# Patient Record
Sex: Female | Born: 1980
Health system: Southern US, Community
[De-identification: ages and names within clinical notes are randomized; demographics above are authoritative.]

## PROBLEM LIST (undated history)

## (undated) ENCOUNTER — Inpatient Hospital Stay (HOSPITAL_COMMUNITY): Payer: Self-pay

## (undated) DIAGNOSIS — E119 Type 2 diabetes mellitus without complications: Secondary | ICD-10-CM

## (undated) DIAGNOSIS — R1013 Epigastric pain: Secondary | ICD-10-CM

## (undated) DIAGNOSIS — K8020 Calculus of gallbladder without cholecystitis without obstruction: Secondary | ICD-10-CM

## (undated) DIAGNOSIS — R51 Headache: Secondary | ICD-10-CM

## (undated) DIAGNOSIS — R21 Rash and other nonspecific skin eruption: Secondary | ICD-10-CM

## (undated) DIAGNOSIS — Z8619 Personal history of other infectious and parasitic diseases: Secondary | ICD-10-CM

## (undated) DIAGNOSIS — E785 Hyperlipidemia, unspecified: Secondary | ICD-10-CM

## (undated) DIAGNOSIS — O24419 Gestational diabetes mellitus in pregnancy, unspecified control: Secondary | ICD-10-CM

## (undated) HISTORY — DX: Epigastric pain: R10.13

## (undated) HISTORY — PX: RHINOPLASTY: SUR1284

## (undated) HISTORY — DX: Hyperlipidemia, unspecified: E78.5

## (undated) HISTORY — DX: Headache: R51

## (undated) HISTORY — DX: Rash and other nonspecific skin eruption: R21

## (undated) HISTORY — PX: CHOLECYSTECTOMY: SHX55

## (undated) HISTORY — DX: Calculus of gallbladder without cholecystitis without obstruction: K80.20

## (undated) HISTORY — DX: Personal history of other infectious and parasitic diseases: Z86.19

---
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
## 2001-12-26 ENCOUNTER — Other Ambulatory Visit: Admission: RE | Admit: 2001-12-26 | Discharge: 2001-12-26 | Payer: Self-pay | Admitting: Internal Medicine

## 2003-08-09 ENCOUNTER — Other Ambulatory Visit: Admission: RE | Admit: 2003-08-09 | Discharge: 2003-08-09 | Payer: Self-pay | Admitting: Internal Medicine

## 2005-06-16 ENCOUNTER — Other Ambulatory Visit: Admission: RE | Admit: 2005-06-16 | Discharge: 2005-06-16 | Payer: Self-pay | Admitting: Obstetrics and Gynecology

## 2006-05-15 ENCOUNTER — Emergency Department (HOSPITAL_COMMUNITY): Admission: EM | Admit: 2006-05-15 | Discharge: 2006-05-15 | Payer: Self-pay | Admitting: Emergency Medicine

## 2006-12-02 ENCOUNTER — Other Ambulatory Visit: Admission: RE | Admit: 2006-12-02 | Discharge: 2006-12-02 | Payer: Self-pay | Admitting: Internal Medicine

## 2007-12-07 ENCOUNTER — Inpatient Hospital Stay (HOSPITAL_COMMUNITY): Admission: AD | Admit: 2007-12-07 | Discharge: 2007-12-07 | Payer: Self-pay | Admitting: Obstetrics and Gynecology

## 2008-03-15 ENCOUNTER — Inpatient Hospital Stay (HOSPITAL_COMMUNITY): Admission: AD | Admit: 2008-03-15 | Discharge: 2008-03-15 | Payer: Self-pay | Admitting: Obstetrics and Gynecology

## 2008-03-16 ENCOUNTER — Inpatient Hospital Stay (HOSPITAL_COMMUNITY): Admission: AD | Admit: 2008-03-16 | Discharge: 2008-03-16 | Payer: Self-pay | Admitting: Obstetrics and Gynecology

## 2008-05-10 ENCOUNTER — Ambulatory Visit (HOSPITAL_COMMUNITY): Admission: RE | Admit: 2008-05-10 | Discharge: 2008-05-10 | Payer: Self-pay | Admitting: Gynecology

## 2008-09-20 ENCOUNTER — Ambulatory Visit: Payer: Self-pay | Admitting: Internal Medicine

## 2008-11-06 ENCOUNTER — Ambulatory Visit: Payer: Self-pay | Admitting: Internal Medicine

## 2009-01-08 ENCOUNTER — Encounter: Admission: RE | Admit: 2009-01-08 | Discharge: 2009-01-08 | Payer: Self-pay | Admitting: Obstetrics and Gynecology

## 2009-02-27 ENCOUNTER — Inpatient Hospital Stay (HOSPITAL_COMMUNITY): Admission: AD | Admit: 2009-02-27 | Discharge: 2009-03-01 | Payer: Self-pay | Admitting: Obstetrics and Gynecology

## 2010-01-23 ENCOUNTER — Ambulatory Visit: Payer: Self-pay | Admitting: Internal Medicine

## 2010-03-03 ENCOUNTER — Ambulatory Visit: Payer: Self-pay | Admitting: Internal Medicine

## 2010-03-04 ENCOUNTER — Ambulatory Visit: Payer: Self-pay | Admitting: Internal Medicine

## 2010-03-05 ENCOUNTER — Encounter
Admission: RE | Admit: 2010-03-05 | Discharge: 2010-03-05 | Payer: Self-pay | Source: Home / Self Care | Attending: Internal Medicine | Admitting: Internal Medicine

## 2010-04-01 ENCOUNTER — Ambulatory Visit: Admit: 2010-04-01 | Payer: Self-pay | Admitting: Internal Medicine

## 2010-04-03 ENCOUNTER — Ambulatory Visit: Admit: 2010-04-03 | Payer: Self-pay | Admitting: Internal Medicine

## 2010-06-24 LAB — RH IMMUNE GLOB WKUP(>/=20WKS)(NOT WOMEN'S HOSP)

## 2010-06-24 LAB — CBC
HCT: 34.6 % — ABNORMAL LOW (ref 36.0–46.0)
Hemoglobin: 11.8 g/dL — ABNORMAL LOW (ref 12.0–15.0)
Hemoglobin: 13.3 g/dL (ref 12.0–15.0)
MCHC: 33.6 g/dL (ref 30.0–36.0)
MCV: 91.5 fL (ref 78.0–100.0)
Platelets: 220 10*3/uL (ref 150–400)
RBC: 4.35 MIL/uL (ref 3.87–5.11)
WBC: 13.3 10*3/uL — ABNORMAL HIGH (ref 4.0–10.5)

## 2010-07-29 ENCOUNTER — Telehealth: Payer: Self-pay

## 2010-07-29 NOTE — Telephone Encounter (Signed)
Patient calls with ongoing abd. Pain. Has seen Dr Lenord Fellers in December for this, diagnosed with epigastric pain. UGI done and was within normal limits. Labs drawn, also within normal limits. Refer to GI MD per Dr. Lenord Fellers. Pt informed.

## 2010-07-30 ENCOUNTER — Telehealth: Payer: Self-pay

## 2010-08-12 NOTE — Telephone Encounter (Signed)
completed

## 2010-08-29 ENCOUNTER — Ambulatory Visit (INDEPENDENT_AMBULATORY_CARE_PROVIDER_SITE_OTHER): Payer: BC Managed Care – PPO | Admitting: Gastroenterology

## 2010-08-29 ENCOUNTER — Other Ambulatory Visit (INDEPENDENT_AMBULATORY_CARE_PROVIDER_SITE_OTHER): Payer: BC Managed Care – PPO

## 2010-08-29 ENCOUNTER — Encounter: Payer: Self-pay | Admitting: Gastroenterology

## 2010-08-29 DIAGNOSIS — R1013 Epigastric pain: Secondary | ICD-10-CM

## 2010-08-29 DIAGNOSIS — K219 Gastro-esophageal reflux disease without esophagitis: Secondary | ICD-10-CM

## 2010-08-29 LAB — TSH: TSH: 0.87 u[IU]/mL (ref 0.35–5.50)

## 2010-08-29 LAB — HEPATIC FUNCTION PANEL
Alkaline Phosphatase: 61 U/L (ref 39–117)
Bilirubin, Direct: 0.1 mg/dL (ref 0.0–0.3)
Total Bilirubin: 0.8 mg/dL (ref 0.3–1.2)

## 2010-08-29 LAB — CBC WITH DIFFERENTIAL/PLATELET
Basophils Absolute: 0 10*3/uL (ref 0.0–0.1)
Basophils Relative: 0.5 % (ref 0.0–3.0)
Eosinophils Absolute: 0.1 10*3/uL (ref 0.0–0.7)
HCT: 41.9 % (ref 36.0–46.0)
Hemoglobin: 14.4 g/dL (ref 12.0–15.0)
Lymphs Abs: 2.6 10*3/uL (ref 0.7–4.0)
MCHC: 34.3 g/dL (ref 30.0–36.0)
MCV: 87.6 fl (ref 78.0–100.0)
Neutro Abs: 3.9 10*3/uL (ref 1.4–7.7)
RBC: 4.79 Mil/uL (ref 3.87–5.11)
RDW: 12.9 % (ref 11.5–14.6)

## 2010-08-29 LAB — BASIC METABOLIC PANEL
GFR: 95.4 mL/min (ref >60.00)
Glucose, Bld: 101 mg/dL — ABNORMAL HIGH (ref 70–99)
Potassium: 4 mEq/L (ref 3.5–5.1)
Sodium: 141 mEq/L (ref 135–145)

## 2010-08-29 LAB — LIPASE: Lipase: 35 U/L (ref 11.0–59.0)

## 2010-08-29 LAB — IBC PANEL: Saturation Ratios: 33.6 % (ref 20.0–50.0)

## 2010-08-29 LAB — SEDIMENTATION RATE: Sed Rate: 10 mm/hr (ref 0–22)

## 2010-08-29 MED ORDER — HYOSCYAMINE SULFATE 0.125 MG SL SUBL: 0.1250 mg | SUBLINGUAL_TABLET | SUBLINGUAL | Status: DC | PRN

## 2010-08-29 MED ORDER — TRAMADOL HCL 50 MG PO TABS: 50.0000 mg | ORAL_TABLET | Freq: Four times a day (QID) | ORAL | Status: AC | PRN

## 2010-08-29 MED ORDER — DEXLANSOPRAZOLE 60 MG PO CPDR: 60.0000 mg | DELAYED_RELEASE_CAPSULE | Freq: Every day | ORAL | Status: AC

## 2010-08-29 NOTE — Patient Instructions (Signed)
Stop Advil. Take Tramadol as needed.  Your prescription(s) have been sent to you pharmacy.  Please go to the basement today for your labs.  Your procedure has been scheduled for 09/17/2010, please follow the seperate instructions.  Your abdominal ultrasound is scheduled for 09/17/2010 at [facility] please arrive at 8:45am for your 9:00am, please have nothing to eat or drink after midnight.

## 2010-08-29 NOTE — Progress Notes (Signed)
History of Present Illness:  This is a 30-year-old Caucasian female who has had a one-year history of intermittent spasmodic epigastric abdominal pain that wakes her at 2 to 4 AM and may last from anywhere to 30 minutes to several hours. There is no associated nausea and vomiting, fever, chills, or systemic complaints. She does have acid reflux symptoms but no dysphagia. Currently she takes Zegerid 40 mg with 4 Advil before bedtime for pain control. Upper GI series in December was unremarkable and reviewed today. Her primary care physician is done extensive laboratory testing which has been negative including negative H. pylori antibiotics. Patient denies a history of hepatitis, pancreatitis, or abuse of alcohol or cigarettes. Family history is noncontributory. She has not had previous endoscopic exams. There is no history of lower gastrointestinal problems, icterus, light colored stools, or itching. Her mental status is normal.  I have reviewed this patient's present history, medical and surgical past history, allergies and medications.     ROS: The remainder of the 10 point ROS is negative.. normal menstrual cycles with recent negative pregnancy test. She denies any gynecologic or genitourinary problems. Also no history of cardiovascular or pulmonary complaints. Her appetite is good her weight is stable, and she denies any specific food intolerances .  Past Medical History  Diagnosis Date  . Epigastric pain   . Constipation    Past Surgical History  Procedure Date  . Rhinoplasty     reports that she has never smoked. She has never used smokeless tobacco. She reports that she does not drink alcohol or use illicit drugs. family history includes Diabetes in her maternal grandfather and Heart disease in her father. No Known Allergies     Physical Exam: General well developed well nourished patient in no acute distress, appearing her stated age Eyes PERRLA, no icterus, fundoscopic exam per  opthamologist Skin no lesions noted Neck supple, no adenopathy, no thyroid enlargement, no tenderness Chest clear to percussion and auscultation Heart no significant murmurs, gallops or rubs noted Abdomen no hepatosplenomegaly masses or tenderness, BS normal. . Extremities no acute joint lesions, edema, phlebitis or evidence of cellulitis. Neurologic patient oriented x 3, cranial nerves intact, no focal neurologic deficits noted. Psychological mental status normal and normal affect.  Assessment and plan: Very unusual spasmodic upper gastrointestinal pain without specific precipitating or alleviating elements. Differential diagnoses would include cholelithiasis, recurrent pancreatitis, ampullary spasm, esophageal spasm from acid reflux, NSAID-induced upper GI lesions or structural GI lesion such as a GIST tumor. I've ordered upper abdominal ultrasound exam, endoscopic exam, and I changed her Dexilant 60 mg before supper with when necessary tramadol 50 mg every 6-8 hours and when necessary sublingual Levsin. Repeat labs ordered including amylase and lipase and liver profile. She is to avoid NSAIDs and to follow strict antireflux maneuvers.  Please copy her primary care physician, referring physician, and pertinent subspecialists.  Encounter Diagnoses  Name Primary?  . Esophageal reflux   . Epigastric pain

## 2010-09-01 LAB — CELIAC PANEL 10
Gliadin IgG: 13 U/mL (ref <20)
IgA: 304 mg/dL (ref 69–380)
Tissue Transglutaminase Ab, IgA: 5.9 U/mL (ref <20)

## 2010-09-07 NOTE — Procedures (Deleted)
[facility]                             EXERCISE TREADMILL  NAME:[name], Judy                      MRN:          [id number] DATE:09/07/2010                            DOB:          Feb 05, 1981   Judy [name] called stating that she is having pain in her upper abdomen as described to Dr. Eloise Harman at her office visit.  Pain is of moderate intensity.  She is scheduled for scans and apparently upper endoscopy on June 27.  I expect her to go to the emergency room if the pain becomes severe. Otherwise, she was instructed to call the office tomorrow to see whether the test can be made earlier.    Barbette Hair. Arlyce Dice, MD,FACG    RDK/MedQ  DD: 09/07/2010  DT: 09/07/2010  Job #: [id number]  cc:   Barry Dienes. Eloise Harman, M.D.

## 2010-09-08 ENCOUNTER — Telehealth: Payer: Self-pay | Admitting: Gastroenterology

## 2010-09-08 NOTE — Telephone Encounter (Addendum)
Pt spoke with Dr Arlyce Dice yesterday and he stated with her s&s maybe she needs her EGD scheduled earlier . Pt moved to 09/10/10 at 4pm. Pt asked about her abd. U/S on 09/17/10 and I informed her we may cancel depending on what they find on the EGD and/or the biopsy. Pt stated understanding.

## 2010-09-09 NOTE — Assessment & Plan Note (Signed)
[facility] [place] [facility]                                ON-CALL NOTE  NAME:MEHLERLetishia, Elliott                      MRN:          [id number] DATE:09/07/2010                            DOB:          1981-03-10   Ms. Jue called stating that she is having pain in her upper abdomen as described to Dr. Jarold Motto at her office visit.  Pain is of moderate intensity.  She is scheduled for scans and apparently upper endoscopy on June 27.  I expect her to go to the emergency room if the pain becomes severe. Otherwise, she was instructed to call the office tomorrow to see whether the test can be made earlier.    Barbette Hair. Arlyce Dice, MD,FACG    RDK/MedQ  DD: 09/07/2010  DT: 09/07/2010  Job #: [id number]  cc:   Vania Rea. Jarold Motto, MD, Caleen Essex, FAGA

## 2010-09-10 ENCOUNTER — Encounter: Payer: Self-pay | Admitting: *Deleted

## 2010-09-10 ENCOUNTER — Ambulatory Visit (AMBULATORY_SURGERY_CENTER): Payer: BC Managed Care – PPO | Admitting: Gastroenterology

## 2010-09-10 ENCOUNTER — Encounter: Payer: Self-pay | Admitting: Gastroenterology

## 2010-09-10 VITALS — BP 106/74 | HR 88 | Temp 98.2°F | Resp 18 | Ht 70.0 in | Wt 187.0 lb

## 2010-09-10 DIAGNOSIS — I890 Lymphedema, not elsewhere classified: Secondary | ICD-10-CM

## 2010-09-10 DIAGNOSIS — R109 Unspecified abdominal pain: Secondary | ICD-10-CM

## 2010-09-10 DIAGNOSIS — R1013 Epigastric pain: Secondary | ICD-10-CM

## 2010-09-10 MED ORDER — OXYCODONE-ACETAMINOPHEN 10-325 MG PO TABS: 1.0000 | ORAL_TABLET | Freq: Four times a day (QID) | ORAL | Status: AC | PRN

## 2010-09-10 MED ORDER — SODIUM CHLORIDE 0.9 % IV SOLN: 500.0000 mL | INTRAVENOUS | Status: DC

## 2010-09-10 MED ORDER — OXYCODONE-ACETAMINOPHEN 10-325 MG PO TABS: 1.0000 | ORAL_TABLET | Freq: Four times a day (QID) | ORAL | Status: DC | PRN

## 2010-09-10 NOTE — Telephone Encounter (Signed)
Error

## 2010-09-11 ENCOUNTER — Telehealth: Payer: Self-pay | Admitting: *Deleted

## 2010-09-11 DIAGNOSIS — R1013 Epigastric pain: Secondary | ICD-10-CM

## 2010-09-11 LAB — HELICOBACTER PYLORI SCREEN-BIOPSY: UREASE: NEGATIVE

## 2010-09-11 NOTE — Telephone Encounter (Signed)

## 2010-09-11 NOTE — Telephone Encounter (Signed)
Per Dr Jarold Motto patients mother would like her ultrasound moved up before 6/27. I have called and they can do her ultrasound on 09/12/2010 at 7:30am. Pt aware.

## 2010-09-12 ENCOUNTER — Ambulatory Visit (HOSPITAL_COMMUNITY)
Admission: RE | Admit: 2010-09-12 | Discharge: 2010-09-12 | Disposition: A | Discharge status: Home / Self Care | Payer: BC Managed Care – PPO | Source: Ambulatory Visit | Attending: Gastroenterology | Admitting: Gastroenterology

## 2010-09-12 ENCOUNTER — Telehealth: Payer: Self-pay | Admitting: *Deleted

## 2010-09-12 DIAGNOSIS — R1013 Epigastric pain: Secondary | ICD-10-CM | POA: Insufficient documentation

## 2010-09-12 DIAGNOSIS — K5900 Constipation, unspecified: Secondary | ICD-10-CM | POA: Insufficient documentation

## 2010-09-12 DIAGNOSIS — K219 Gastro-esophageal reflux disease without esophagitis: Secondary | ICD-10-CM

## 2010-09-12 DIAGNOSIS — K8070 Calculus of gallbladder and bile duct without cholecystitis without obstruction: Secondary | ICD-10-CM | POA: Insufficient documentation

## 2010-09-12 NOTE — Telephone Encounter (Signed)
Elane Fritz called back and pt can be seen on Monday 09/15/2019 with Dr Derrell Lolling at 9:20am, pt advised of appt and I will send notes for the appt.

## 2010-09-12 NOTE — Telephone Encounter (Signed)
Message copied by Leonette Monarch on Fri Sep 12, 2010 11:42 AM ------      Message from: Mardella Layman      Created: Fri Sep 12, 2010 10:34 AM       She needs surgical appointment for laparoscopic cholecystectomy ASAP. Please call her with appointment I spoke with her today by phone.

## 2010-09-15 ENCOUNTER — Ambulatory Visit (INDEPENDENT_AMBULATORY_CARE_PROVIDER_SITE_OTHER): Payer: BC Managed Care – PPO | Admitting: General Surgery

## 2010-09-15 ENCOUNTER — Encounter (INDEPENDENT_AMBULATORY_CARE_PROVIDER_SITE_OTHER): Payer: Self-pay | Admitting: General Surgery

## 2010-09-15 VITALS — BP 124/82 | HR 68 | Temp 97.2°F | Resp 12 | Ht 70.0 in | Wt 190.2 lb

## 2010-09-15 DIAGNOSIS — K8010 Calculus of gallbladder with chronic cholecystitis without obstruction: Secondary | ICD-10-CM

## 2010-09-15 NOTE — Progress Notes (Signed)
Subjective:     Patient ID: Judy [name], female   DOB: 1980/11/15, 30 y.o.   MRN: [id number]    BP 124/82  Pulse 68  Temp(Src) 97.2 F (36.2 C) (Temporal)  Resp 12  Ht 5\' 10"  (1.778 m)  Wt 190 lb 3.2 oz (86.274 kg)  BMI 27.29 kg/m2  LMP 08/22/2010    HPI This is a 30 year old Caucasian female, sent to me through the courtesy of Dr. Sheryn Bison. Her primary care physician is Dr. Eden Emms Baxley.  She has a one-year history of nocturnal epigastric pain. She denies nausea and vomiting. Initially this was not  frequent but now is occurring anywhere between 3 and 14 days.  GI series which was unremarkable. Upper endoscopy was unremarkable. She was given a prescription for Protonix which did not help. She says the attack now last 7 hours. She denies any history of jaundice, fever or chills. . Her bowel movements are normal. A gallbladder ultrasound was done and gallstones are seen. . Common bile duct is normal in caliber. . There are no other ultrasonographic abnormalities. She is here today to discuss cholecystectomy.  Review of Systems  Constitutional: Negative.   HENT: Negative.   Eyes: Negative.   Respiratory: Negative.   Cardiovascular: Negative.   Gastrointestinal: Positive for abdominal pain. Negative for nausea, vomiting, diarrhea, constipation, blood in stool, abdominal distention and rectal pain.  Genitourinary: Negative for hematuria and flank pain.  Musculoskeletal: Negative.  Negative for back pain.  Skin: Negative.   Neurological: Negative.   Hematological: Negative.   Psychiatric/Behavioral: Negative.        Objective:   Physical Exam  Constitutional: She is oriented to person, place, and time. She appears well-developed and well-nourished. No distress.  HENT:  Head: Normocephalic and atraumatic.  Eyes: Pupils are equal, round, and reactive to light. No scleral icterus.  Neck: Normal range of motion. No tracheal deviation present. No thyromegaly present.    Cardiovascular: Normal rate, regular rhythm and normal heart sounds.   No murmur heard. Pulmonary/Chest: Effort normal. No respiratory distress. She has no wheezes. She has no rales.  Abdominal: Soft. Bowel sounds are normal. She exhibits no distension and no mass. There is no hepatosplenomegaly. There is no tenderness. There is no rigidity, no rebound, no guarding, no CVA tenderness and negative Murphy's sign. No hernia.  Musculoskeletal: Normal range of motion. She exhibits no edema.  Lymphadenopathy:    She has no cervical adenopathy.  Neurological: She is alert and oriented to person, place, and time. Coordination normal.  Skin: Skin is warm and dry. No rash noted.  Psychiatric: She has a normal mood and affect. Her behavior is normal. Judgment and thought content normal.       Assessment:     Chronic cholecystitis with cholelithiasis. She is having frequent biliary colic.    Plan:     We will schedule her for a laparoscopic cholecystectomy with cholangiogram. I have discussed the indications and details of the cholecystectomy procedure with the patient. Risks and complications have been outlined, including but not limited to bleeding, infection, conversion to open laparotomy, bile leak, injury to adjacent organs such as the common bile duct or intestine, wound problems, cardiac, pulmonary and thromboembolic problems.  All of their questions have been answered. They understand and agree with the plan.

## 2010-09-16 ENCOUNTER — Encounter: Payer: Self-pay | Admitting: Gastroenterology

## 2010-09-17 ENCOUNTER — Other Ambulatory Visit (HOSPITAL_COMMUNITY): Payer: BC Managed Care – PPO

## 2010-09-17 ENCOUNTER — Telehealth: Payer: Self-pay | Admitting: Gastroenterology

## 2010-09-17 ENCOUNTER — Other Ambulatory Visit: Payer: BC Managed Care – PPO | Admitting: Gastroenterology

## 2010-09-18 NOTE — Telephone Encounter (Signed)
Pt called to report she saw Dr Derrell Lolling and the 2 surgery dates they gave her were 10/10/10 and 10/28/10. Pt needs to go out of town from 10/08/10- 10/13/10; if she doesn't go, her husband will be gone and she has an 49month old. Pt basically wants to know if it's safe for her to wait until August for the surgery and what are the complications should she wait. Informed pt per Dr Jarold Motto, the complications would be increased pain and possibly Pancreatitis. I did not and left word with [facility] scheduling to ask if pt could be placed on a cancellation list. Informed pt I will let her know what [facility] tells me. In the meantime, if her pain should become more severe or if she develops jaundice, she should go to the ER. Per Dr Jarold Motto, he did give her Hydrocodone for pain. Pt reports pain last pm, but she only had Tramadol and it didn't work as well as Hydrocodone. Pt stated understanding.

## 2010-09-19 ENCOUNTER — Telehealth: Payer: Self-pay | Admitting: Gastroenterology

## 2010-09-19 NOTE — Telephone Encounter (Signed)
Pat from [facility] scheduling called to report she has spoken with the pt and they have given her the earliest surgical date possible for her schedule and Dr Jacinto Halim. lmom for pt to call back.

## 2010-09-22 NOTE — Telephone Encounter (Signed)
Pt never called back.

## 2010-09-23 ENCOUNTER — Other Ambulatory Visit (INDEPENDENT_AMBULATORY_CARE_PROVIDER_SITE_OTHER): Payer: Self-pay | Admitting: Surgery

## 2010-09-23 ENCOUNTER — Emergency Department (HOSPITAL_COMMUNITY): Payer: BC Managed Care – PPO

## 2010-09-23 ENCOUNTER — Observation Stay (HOSPITAL_COMMUNITY)
Admission: EM | Admit: 2010-09-23 | Discharge: 2010-09-24 | Disposition: A | Discharge status: Home / Self Care | Payer: BC Managed Care – PPO | Attending: Surgery | Admitting: Surgery

## 2010-09-23 DIAGNOSIS — K8010 Calculus of gallbladder with chronic cholecystitis without obstruction: Secondary | ICD-10-CM

## 2010-09-23 DIAGNOSIS — K219 Gastro-esophageal reflux disease without esophagitis: Secondary | ICD-10-CM | POA: Insufficient documentation

## 2010-09-23 DIAGNOSIS — K8000 Calculus of gallbladder with acute cholecystitis without obstruction: Principal | ICD-10-CM | POA: Insufficient documentation

## 2010-09-23 DIAGNOSIS — R1013 Epigastric pain: Secondary | ICD-10-CM | POA: Insufficient documentation

## 2010-09-23 LAB — DIFFERENTIAL
Eosinophils Absolute: 0.2 10*3/uL (ref 0.0–0.7)
Lymphs Abs: 2.7 10*3/uL (ref 0.7–4.0)
Monocytes Relative: 6 % (ref 3–12)
Neutro Abs: 5.3 10*3/uL (ref 1.7–7.7)
Neutrophils Relative %: 60 % (ref 43–77)

## 2010-09-23 LAB — COMPREHENSIVE METABOLIC PANEL
ALT: 24 U/L (ref 0–35)
AST: 22 U/L (ref 0–37)
CO2: 25 mEq/L (ref 19–32)
Chloride: 102 mEq/L (ref 96–112)
Creatinine, Ser: 0.7 mg/dL (ref 0.50–1.10)
GFR calc Af Amer: >60 mL/min (ref >60)
GFR calc non Af Amer: >60 mL/min (ref >60)
Glucose, Bld: 113 mg/dL — ABNORMAL HIGH (ref 70–99)
Sodium: 138 mEq/L (ref 135–145)
Total Bilirubin: 0.3 mg/dL (ref 0.3–1.2)

## 2010-09-23 LAB — URINALYSIS, ROUTINE W REFLEX MICROSCOPIC
Leukocytes, UA: NEGATIVE
Nitrite: NEGATIVE
Specific Gravity, Urine: 1.027 (ref 1.005–1.030)
Urobilinogen, UA: 0.2 mg/dL (ref 0.0–1.0)

## 2010-09-23 LAB — CBC
Hemoglobin: 14.7 g/dL (ref 12.0–15.0)
MCH: 29.8 pg (ref 26.0–34.0)
MCV: 83.2 fL (ref 78.0–100.0)
Platelets: 255 10*3/uL (ref 150–400)
RBC: 4.93 MIL/uL (ref 3.87–5.11)
WBC: 8.7 10*3/uL (ref 4.0–10.5)

## 2010-09-25 NOTE — Op Note (Signed)
NAMEMADISON, Judy [name]             ACCOUNT NO.:  [id number]  MEDICAL RECORD NO.:  [id number]  LOCATION:  1603                         FACILITY:  [facility]  PHYSICIAN:  Currie Paris, M.D.DATE OF BIRTH:  28-Dec-1980  DATE OF PROCEDURE:  09/23/2010 DATE OF DISCHARGE:                              OPERATIVE REPORT   PREOPERATIVE DIAGNOSIS:  Early acute calculus cholecystitis  POSTOPERATIVE DIAGNOSIS:  Early acute calculus cholecystitis  PROCEDURE:  Laparoscopic cholecystectomy with operative cholangiogram.  SURGEON:  Currie Paris, M.D.  ANESTHESIA:  General.  CLINICAL HISTORY:  This nice lady has had a 1-year history of epigastric pain, been having more and more episodes and was evaluated and found to have gallstones.  She saw my associate, Dr. Derrell Lolling, about a week ago and was scheduled for cholecystectomy.  She presented to the emergency room having ongoing episodes of pain such that she could not tolerate them at home.  Examination today showed her to be basically nontender. Laboratory studies were normal.  However, because of her ongoing pain, I thought she was developing acute cholecystitis and recommended to proceed to cholecystectomy.  The patient agreed.  I reviewed the plans for the procedure and they had also been reviewed by Dr. Derrell Lolling with her.  DESCRIPTION OF PROCEDURE:  The patient was taken to the operating room. After satisfactory general endotracheal anesthesia had been obtained, the abdomen was prepped and draped and the time-out was done.  I used 0.25% plain Marcaine for each incision.  I identified the fascia in the umbilicus and opened it.  There was a tiny bit of fat protruding through a tiny umbilical defect higher than that but I did not enter into that.  Roseanne Reno was placed and the abdomen insufflated to 15.  Under direct vision, a 10-11 trocar was placed in epigastrium and two 5 laterally.  The gallbladder was somewhat tense and with early  inflammatory signs but I was able to grasp it with a grasper.  It was somewhat distorted because of its distention.  There was a stone impacted near the cystic duct and the ampulla of the gallbladder.  With retraction laterally on the ampule of the gallbladder, I was able to identify a long segment of cystic duct and isolate that.  I also saw the cystic artery traveling up alongside the gallbladder and left that intact for a little while so I could make sure that it was not going back into the liver.  I put a clip on the cystic duct and opened it.  An operative angiography was done using a percutaneously placed [facility] catheter and that was normal.  Three clips were placed on the stay side of the cystic duct.  The gallbladder was removed from below to above and when I traced the what I thought was the cystic artery further up and so I clearly entering into the gallbladder, it was clipped and divided with a couple of small branches divided as they were encountered on the bed of the gallbladder.  The gallbladder was placed in a bag and brought out through the umbilical port.  I replaced trocar and reinsufflated and made sure everything was dry along the bed of gallbladder and there  was no bleeding or bile leak.  I suctioned out any remaining irrigant.  The lateral ports were removed under direct vision and there was no bleeding.  The umbilical site was closed with a pursestring.  The abdomen was deflated through the epigastric port.  Skin was closed with 4-0 Monocryl subcuticular plus Dermabond.  The patient tolerated the procedure well.  There were no operative complications.  All counts were correct.     Currie Paris, M.D.     CJS/MEDQ  D:  09/23/2010  T:  09/23/2010  Job:  [id number]  cc:   Vania Rea. Jarold Motto, MD, FACG, FACP, FAGA [address] [place] [address]  Luanna Cole. Lenord Fellers, M.D. Fax: [phone]  Electronically Signed by Cyndia Bent M.D. on 09/25/2010  02:14:37 PM

## 2010-10-21 ENCOUNTER — Encounter (INDEPENDENT_AMBULATORY_CARE_PROVIDER_SITE_OTHER): Payer: Self-pay | Admitting: Surgery

## 2010-10-21 ENCOUNTER — Ambulatory Visit (INDEPENDENT_AMBULATORY_CARE_PROVIDER_SITE_OTHER): Payer: BC Managed Care – PPO | Admitting: Surgery

## 2010-10-21 VITALS — BP 132/88 | HR 72 | Temp 96.9°F

## 2010-10-21 DIAGNOSIS — N63 Unspecified lump in unspecified breast: Secondary | ICD-10-CM

## 2010-10-21 NOTE — Progress Notes (Signed)
Addended by: Currie Paris on: 10/21/2010 02:53 PM   Modules accepted: Level of Service

## 2010-10-21 NOTE — Progress Notes (Addendum)
  MEDS: Current Outpatient Prescriptions on File Prior to Visit  Medication Sig Dispense Refill  . hyoscyamine (LEVSIN/SL) 0.125 MG SL tablet Place 0.125 mg under the tongue as needed.        Marland Kitchen oxyCODONE-acetaminophen (PERCOCET) 10-325 MG per tablet Take 1 tablet by mouth every 6 (six) hours as needed for pain.  20 tablet  0  . Prenatal Vit-Fe Sulfate-FA (PRENATAL VITAMIN PO) Take by mouth 46 HOURS.        Marland Kitchen traMADol (ULTRAM) 50 MG tablet Take 1 tablet (50 mg total) by mouth every 6 (six) hours as needed for pain.  60 tablet  1   Current Facility-Administered Medications on File Prior to Visit  Medication Dose Route Frequency Provider Last Rate Last Dose  . 0.9 %  sodium chloride infusion  500 mL Intravenous Continuous Sheryn Bison, MD         ALLERGIES:No Known Allergies  Past Surgical History  Procedure Date  . Rhinoplasty   . Cholecystectomy     Past Medical History  Diagnosis Date  . Epigastric pain   . Constipation   . Hyperlipidemia   . Gallstones     Recently diagnosed by Dr. Jarold Motto.  . Rash     Bruises easily, per medical history form dated 09/15/10     Chief complaint: Postop laparoscopic cholecystectomy  History of present illness: This patient underwent microscopic cholecystectomy as an urgent procedure about three weeks ago. She is in for her followup visit. She feels like she is doing well with no particular problems.  Exam: Gen.: Patient is alert oriented and healthy-appearing Abdomen: The abdomen is soft and benign. All incisions are healing nicely. There are no problems.   Data reviewed I looked over the notes pathology report Melvern Banker from her hospitalization  Impression doing well status post laparoscopic cholecystectomy  Plan: We will see back p.r.n. She can have normal activities.

## 2010-10-21 NOTE — Patient Instructions (Addendum)
Come back should any further problems occur.

## 2010-11-23 NOTE — Discharge Summary (Signed)
  [name], Judy [name]             ACCOUNT NO.:  [id number]  MEDICAL RECORD NO.:  [id number]  LOCATION:  1603                         FACILITY:  [facility]  PHYSICIAN:  Currie Paris, M.D.DATE OF BIRTH:  1980/07/08  DATE OF ADMISSION:  09/23/2010 DATE OF DISCHARGE:  09/24/2010                              DISCHARGE SUMMARY   FINAL DIAGNOSIS:  Chronic calculous cholecystitis.  CLINICAL HISTORY:  This patient presented to the emergency room with ongoing abdominal pain.  She had originally been scheduled for elective laparoscopic cholecystectomy.  HOSPITAL COURSE:  The patient was admitted and taken to the operating room where what looked like early acute calculous cholecystitis was found and laparoscopic cholecystectomy and operative cholangiogram performed.  Postoperatively, the patient had difficulty voiding, but appeared stable after surgery.  We kept her overnight and she was able to go home on the next day.  The patient discharged in satisfactory condition, to take some pain medications for postop pain relief, and to be followed in our office in approximately 2 weeks.     Currie Paris, M.D.     CJS/MEDQ  D:  11/20/2010  T:  11/20/2010  Job:  [id number]  Electronically Signed by Cyndia Bent M.D. on 11/23/2010 08:28:10 AM

## 2011-01-27 ENCOUNTER — Encounter: Payer: Self-pay | Admitting: Internal Medicine

## 2011-01-29 ENCOUNTER — Other Ambulatory Visit: Payer: BC Managed Care – PPO | Admitting: Internal Medicine

## 2011-06-25 ENCOUNTER — Encounter: Payer: Self-pay | Admitting: Internal Medicine

## 2011-06-25 ENCOUNTER — Ambulatory Visit (INDEPENDENT_AMBULATORY_CARE_PROVIDER_SITE_OTHER): Payer: BC Managed Care – PPO | Admitting: Internal Medicine

## 2011-06-25 VITALS — BP 110/78 | HR 76 | Temp 98.7°F | Wt 180.0 lb

## 2011-06-25 DIAGNOSIS — H6590 Unspecified nonsuppurative otitis media, unspecified ear: Secondary | ICD-10-CM

## 2011-06-25 DIAGNOSIS — H6593 Unspecified nonsuppurative otitis media, bilateral: Secondary | ICD-10-CM

## 2011-06-26 ENCOUNTER — Encounter: Payer: Self-pay | Admitting: Internal Medicine

## 2011-06-26 NOTE — Progress Notes (Signed)
  Subjective:    Patient ID: Judy [name], female    DOB: April 11, 1980, 31 y.o.   MRN: [id number]  HPI 31 year old white female with prior history of cholecystectomy in today with complaint of ear pressure. She is leaving this weekend to fly to [facility] to board ship for cruise to the [place]. She is concerned about flying and ear issues. No fever. No sore throat. Has a 7-year-old child. She and her husband will be traveling without the child. No history of chronic medical problems    Review of Systems     Objective:   Physical Exam. HEENT exam: TMs are full bilaterally but not red. Pharynx is noninjected. Neck is supple without adenopathy. Chest clear.        Assessment & Plan:  Bilateral serous otitis media  Plan: Zithromax Z-Pak take 2 tablets day one followed by 1 tablet by mouth days 2 through 5. In case of nausea on cruise, Phenergan 25 mg tablets (#30) 1 by mouth every 4 hours when necessary nausea

## 2011-06-26 NOTE — Patient Instructions (Signed)
Take antibiotic as prescribed. Use Phenergan if needed on cruise for nausea.

## 2011-10-09 ENCOUNTER — Encounter: Payer: Self-pay | Admitting: Internal Medicine

## 2011-10-09 ENCOUNTER — Ambulatory Visit (INDEPENDENT_AMBULATORY_CARE_PROVIDER_SITE_OTHER): Payer: BC Managed Care – PPO | Admitting: Internal Medicine

## 2011-10-09 VITALS — BP 108/68 | HR 80 | Temp 99.4°F | Ht 70.0 in | Wt 182.0 lb

## 2011-10-09 DIAGNOSIS — R300 Dysuria: Secondary | ICD-10-CM

## 2011-10-09 DIAGNOSIS — N390 Urinary tract infection, site not specified: Secondary | ICD-10-CM

## 2011-10-09 LAB — POCT URINALYSIS DIPSTICK: Urobilinogen, UA: NEGATIVE

## 2011-10-09 NOTE — Patient Instructions (Addendum)
Take Cipro 500 mg twice daily for 7 days. Call if not better in 48 hours.

## 2011-10-09 NOTE — Progress Notes (Signed)
  Subjective:    Patient ID: Judy [name], female    DOB: 1980/06/22, 31 y.o.   MRN: [id number]  HPI Onset 2 days ago of urinary frequency and dysuria. Some bladder pressure. No fever or shaking chills. No back pain. Patient is married and sexually active. No history of frequent urinary tract infections.    Review of Systems     Objective:   Physical Exam no CVA tenderness. Urinalysis abnormal. Culture sent.        Assessment & Plan:  Urinary tract infection  Plan: Cipro 500 mg twice daily for 7 days. Call if not better in 48 hours. Cultures pending.

## 2011-10-12 LAB — URINE CULTURE

## 2011-12-01 ENCOUNTER — Other Ambulatory Visit (HOSPITAL_COMMUNITY): Payer: Self-pay | Admitting: Gynecology

## 2011-12-01 DIAGNOSIS — Z3141 Encounter for fertility testing: Secondary | ICD-10-CM

## 2011-12-03 ENCOUNTER — Ambulatory Visit (HOSPITAL_COMMUNITY)
Admission: RE | Admit: 2011-12-03 | Discharge: 2011-12-03 | Disposition: A | Discharge status: Home / Self Care | Payer: BC Managed Care – PPO | Source: Ambulatory Visit | Attending: Gynecology | Admitting: Gynecology

## 2011-12-03 DIAGNOSIS — Z3141 Encounter for fertility testing: Secondary | ICD-10-CM

## 2011-12-03 DIAGNOSIS — N979 Female infertility, unspecified: Secondary | ICD-10-CM | POA: Insufficient documentation

## 2011-12-03 MED ORDER — IOHEXOL 300 MG/ML  SOLN: 9.0000 mL | Freq: Once | INTRAMUSCULAR | Status: AC | PRN

## 2012-03-23 NOTE — L&D Delivery Note (Signed)
Delivery Note At 3:08 PM a viable female was delivered via NORMAL SPONTANEOUS VAGINAL  (Presentation:OA ;  ).  APGAR:8/9 , ; weight .   Placenta status:INTACT , .3 VESSEL  Cord:  with the following complications:NONE .  Cord pH: NA  Anesthesia: Epidural  Episiotomy: NONE Lacerations: FIRST DEGREE Suture Repair: 2.0 chromic Est. Blood Loss (mL): 350 CCC  Mom to postpartum.  Baby to Couplet care / Skin to Skin.  Ajee Heasley S 02/14/2013, 3:19 PM

## 2012-06-08 ENCOUNTER — Telehealth: Payer: Self-pay | Admitting: Internal Medicine

## 2012-06-08 NOTE — Telephone Encounter (Signed)
Pt calling about release of medical records to [employer] for application for [employer].  Dixie Dials  out   today. I personally spoke with Gita Kudo at [phone] about this today. We do have records on file. She says several requests have been sent but was told apparently some issue with SS# not matching. Confirmed with her correct DOB, chart and SS# today as I know this pt. Well.   I will speak to Dixie Dials in am. I have asked that they send another request for records. MJB, MD

## 2012-06-09 ENCOUNTER — Telehealth: Payer: Self-pay | Admitting: Internal Medicine

## 2012-06-09 NOTE — Telephone Encounter (Signed)
And insurance company will send a new request with the CORRECT last 4 of SS#.  Verified that information again with patient to ensure we do have the correct last 4 of SS# on file...we do.  Patient understands.

## 2012-07-02 LAB — OB RESULTS CONSOLE ANTIBODY SCREEN: Antibody Screen: NEGATIVE

## 2012-07-02 LAB — OB RESULTS CONSOLE ABO/RH

## 2012-07-02 LAB — OB RESULTS CONSOLE RPR: RPR: NONREACTIVE

## 2012-07-02 LAB — OB RESULTS CONSOLE HEPATITIS B SURFACE ANTIGEN: Hepatitis B Surface Ag: NEGATIVE

## 2012-08-01 ENCOUNTER — Other Ambulatory Visit: Payer: Self-pay

## 2012-08-17 ENCOUNTER — Other Ambulatory Visit (HOSPITAL_COMMUNITY): Payer: Self-pay | Admitting: Obstetrics and Gynecology

## 2012-08-17 DIAGNOSIS — Z3682 Encounter for antenatal screening for nuchal translucency: Secondary | ICD-10-CM

## 2012-08-19 ENCOUNTER — Ambulatory Visit (HOSPITAL_COMMUNITY)
Admission: RE | Admit: 2012-08-19 | Discharge: 2012-08-19 | Disposition: A | Discharge status: Home / Self Care | Payer: BC Managed Care – PPO | Source: Ambulatory Visit | Attending: Obstetrics and Gynecology | Admitting: Obstetrics and Gynecology

## 2012-08-19 ENCOUNTER — Other Ambulatory Visit: Payer: Self-pay

## 2012-08-19 ENCOUNTER — Encounter (HOSPITAL_COMMUNITY): Payer: Self-pay

## 2012-08-19 DIAGNOSIS — O351XX0 Maternal care for (suspected) chromosomal abnormality in fetus, not applicable or unspecified: Secondary | ICD-10-CM | POA: Insufficient documentation

## 2012-08-19 DIAGNOSIS — O3510X0 Maternal care for (suspected) chromosomal abnormality in fetus, unspecified, not applicable or unspecified: Secondary | ICD-10-CM | POA: Insufficient documentation

## 2012-08-19 DIAGNOSIS — Z3682 Encounter for antenatal screening for nuchal translucency: Secondary | ICD-10-CM

## 2012-08-19 DIAGNOSIS — O09299 Supervision of pregnancy with other poor reproductive or obstetric history, unspecified trimester: Secondary | ICD-10-CM | POA: Insufficient documentation

## 2012-08-19 DIAGNOSIS — Z3689 Encounter for other specified antenatal screening: Secondary | ICD-10-CM | POA: Insufficient documentation

## 2012-08-19 DIAGNOSIS — O352XX0 Maternal care for (suspected) hereditary disease in fetus, not applicable or unspecified: Secondary | ICD-10-CM | POA: Insufficient documentation

## 2012-08-30 ENCOUNTER — Telehealth (HOSPITAL_COMMUNITY): Payer: Self-pay | Admitting: MS"

## 2012-08-30 NOTE — Telephone Encounter (Signed)
Called Judy [name] to discuss her cell free fetal DNA test results.  Mrs. Judy [name] had Panorama testing through [facility].  The patient was identified by name and DOB.  We reviewed that these are within normal limits, showing a less than 1 in 10,000 risk for trisomies 21, 18 and 13, and monosomy X (Turner syndrome).  In addition, the risk for triploidy/vanishing twin and sex chromosome trisomies (47,XXX and 47,XXY) was also low risk.  We reviewed that this testing identifies > 99% of pregnancies with trisomy 72, trisomy 60, trisomy 34, sex chromosome trisomies (47,XXX and 47,XXY), and triploidy.  The detection rate for monosomy X is ~92%.  The false positive rate is <0.1% for all conditions. Testing was also consistent with female gender.  The patient did wish to know gender.  She understands that this testing does not identify all genetic conditions.  All questions were answered to her satisfaction, she was encouraged to call with additional questions or concerns.  Quinn Plowman, MS Certified Genetic Counselor 08/30/2012 11:41 AM

## 2012-09-21 ENCOUNTER — Other Ambulatory Visit: Payer: Self-pay | Admitting: *Deleted

## 2012-09-21 ENCOUNTER — Ambulatory Visit (INDEPENDENT_AMBULATORY_CARE_PROVIDER_SITE_OTHER): Payer: BC Managed Care – PPO | Admitting: Internal Medicine

## 2012-09-21 ENCOUNTER — Encounter: Payer: Self-pay | Admitting: Internal Medicine

## 2012-09-21 VITALS — BP 114/76 | HR 77 | Ht 70.0 in | Wt 190.6 lb

## 2012-09-21 DIAGNOSIS — R002 Palpitations: Secondary | ICD-10-CM

## 2012-09-21 DIAGNOSIS — Z331 Pregnant state, incidental: Secondary | ICD-10-CM

## 2012-09-21 DIAGNOSIS — Z3490 Encounter for supervision of normal pregnancy, unspecified, unspecified trimester: Secondary | ICD-10-CM | POA: Insufficient documentation

## 2012-09-21 NOTE — Progress Notes (Signed)
OFFICE NOTE  Chief Complaint:  Palpitations  Primary Care Physician: Judy Mackintosh, MD  HPI:  Judy [name] is a pleasant 32 year old female, referred for evaluation of palpitations. She knows she is in her 6s her 17th week of her second pregnancy. Her first pregnancy she had no complications and has no history of palpitations in the past. She reports that she has episodes of feeling a voiding in her chest, and heart beats that come sporadically. Toward the onset of the pregnancy the beats were more frequent, however recently they have become less significant. During her first pregnancy she did not use any caffeinated products. During this pregnancy she limits her caffeine she says to less than 200 mg daily. She denies any chest pain or worsening shortness of breath, lower extremity edema, presyncope or syncope. There is no history of preeclampsia, however she has had gestational diabetes during both pregnancies. Her family history of heart disease and unfortunately her father died of an MI in his 24s.  PMHx:  Past Medical History  Diagnosis Date  . Epigastric pain   . Constipation   . Hyperlipidemia   . Gallstones     Recently diagnosed by Dr. Jarold [name].  . Rash     Bruises easily, per medical history form dated 09/15/10    Past Surgical History  Procedure Laterality Date  . Rhinoplasty    . Cholecystectomy      FAMHx:  Family History  Problem Relation Age of Onset  . Diabetes Maternal Grandfather   . Heart disease Father   . Heart attack Father     SOCHx:   reports that she has never smoked. She has never used smokeless tobacco. She reports that she does not drink alcohol or use illicit drugs.  ALLERGIES:  No Known Allergies  ROS: A comprehensive review of systems was negative except for: Constitutional: positive for fatigue Cardiovascular: positive for palpitations  HOME MEDS: Current Outpatient Prescriptions  Medication Sig Dispense Refill  . Omega 3-6-9  Fatty Acids (OMEGA 3-6-9 COMPLEX PO) Take 1 capsule by mouth daily.      . Prenatal Vit-Fe Sulfate-FA (PRENATAL VITAMIN PO) Take 1 tablet by mouth daily.        Current Facility-Administered Medications  Medication Dose Route Frequency Provider Last Rate Last Dose  . 0.9 %  sodium chloride infusion  500 mL Intravenous Continuous Judy Layman, MD        LABS/IMAGING: No results found for this or any previous visit (from the past 48 hour(s)). No results found.  VITALS: BP 114/76  Pulse 77  Ht 5\' 10"  (1.778 m)  Wt 190 lb 9.6 oz (86.456 kg)  BMI 27.35 kg/m2  LMP 05/22/2012  EXAM: General appearance: alert and no distress Neck: no adenopathy, no carotid bruit, no JVD, supple, symmetrical, trachea midline and thyroid not enlarged, symmetric, no tenderness/mass/nodules Lungs: clear to auscultation bilaterally Heart: regular rate and rhythm, S1, S2 normal, no murmur, click, rub or gallop Abdomen: Gravid uterus Extremities: extremities normal, atraumatic, no cyanosis or edema Pulses: 2+ and symmetric Skin: Skin color, texture, turgor normal. No rashes or lesions Neurologic: Grossly normal  EKG: Double sinus rhythm at 77 with an isolated PAC  ASSESSMENT: 1. Palpitations 2. Pregnancy  PLAN: 1.   Judy [name] is here for second pregnancy and is having palpitations which are more bothersome. She reports that they occur almost every day but it is much frequent recently. We discussed caffeine use and restricting that to 0 if possible will be helpful.  I suspect these are benign, and may represent PACs or even PVCs by her description. We will go ahead and place a seven-week monitor to see if we can capture some of these palpitations. Plan is to see her back in 2-3 weeks to review results of that monitor.  Thank you again for the kind referral.  Judy Nose, MD, [facility] Attending Cardiologist [facility]  Judy [name] 09/21/2012, 9:00 AM

## 2012-09-21 NOTE — Patient Instructions (Addendum)
Your physician has recommended that you wear an event monitor. Event monitors are medical devices that record the heart's electrical activity. Doctors most often [place] these monitors to diagnose arrhythmias. Arrhythmias are problems with the speed or rhythm of the heartbeat. The monitor is a small, portable device. You can wear one while you do your normal daily activities. This is usually used to diagnose what is causing palpitations/syncope (passing out). -- You will wear this for 7 days.  Dr. Rennis Golden will follow up with you in 2-3 weeks.

## 2012-09-28 ENCOUNTER — Encounter: Payer: Self-pay | Admitting: Internal Medicine

## 2012-09-29 ENCOUNTER — Telehealth: Payer: Self-pay | Admitting: Internal Medicine

## 2012-09-29 NOTE — Telephone Encounter (Signed)
Pt called back and asked if she has noticed extra heartbeats.  Denied.  Pt informed per Dr. Rennis Golden that report did show some extra beats and it is okay to d/c monitor.  Pt also informed per last OV note she is to return in 2-3 weeks to review results.  Pt verbalized understanding and agreed w/ plan.  Appt scheduled for 7.15.14 at 8am w/ Dr. Rennis Golden.

## 2012-09-29 NOTE — Telephone Encounter (Signed)
Dr. Rennis Golden notified and reviewed reports available.  Stated pt has has some extra heartbeats and the question was if she was having palpitations.  Advised RN ask pt if she has felt any extra heartbeats.    Returned call.  No answer/voicemail.  Will try later.

## 2012-09-29 NOTE — Telephone Encounter (Signed)
Judy [name] is calling because her prescription for the heart monitor has ran out and she has not worn it for the number of days in which she was told she would be wearing it and that is because she had some issues with the first monitor and [employer] had to send her a new one . Wants to know what to do .Marland Kitchen Please Call    Thanks

## 2012-10-04 ENCOUNTER — Encounter: Payer: Self-pay | Admitting: Internal Medicine

## 2012-10-04 ENCOUNTER — Ambulatory Visit (INDEPENDENT_AMBULATORY_CARE_PROVIDER_SITE_OTHER): Payer: BC Managed Care – PPO | Admitting: Internal Medicine

## 2012-10-04 VITALS — BP 114/72 | HR 64 | Ht 70.0 in | Wt 194.4 lb

## 2012-10-04 DIAGNOSIS — Z331 Pregnant state, incidental: Secondary | ICD-10-CM

## 2012-10-04 DIAGNOSIS — R002 Palpitations: Secondary | ICD-10-CM

## 2012-10-04 DIAGNOSIS — Z3490 Encounter for supervision of normal pregnancy, unspecified, unspecified trimester: Secondary | ICD-10-CM

## 2012-10-04 NOTE — Patient Instructions (Addendum)
Dr. Wilkie Aye would like you to follow up as needed.

## 2012-10-04 NOTE — Progress Notes (Signed)
OFFICE NOTE  Chief Complaint:  Palpitations  Primary Care Physician: Margaree Mackintosh, MD  HPI:  Judy [name] is a pleasant 32 year old female, referred for evaluation of palpitations. She knows she is in her 56s her 17th week of her second pregnancy. Her first pregnancy she had no complications and has no history of palpitations in the past. She reports that she has episodes of feeling a voiding in her chest, and heart beats that come sporadically. Toward the onset of the pregnancy the beats were more frequent, however recently they have become less significant. During her first pregnancy she did not use any caffeinated products. During this pregnancy she limits her caffeine she says to less than 200 mg daily. She denies any chest pain or worsening shortness of breath, lower extremity edema, presyncope or syncope. There is no history of preeclampsia, however she has had gestational diabetes during both pregnancies. Her family history of heart disease and unfortunately her father died of an MI in his 92s.  Overall [name] is doing fairly well. She continues to have some palpitations, mostly at night. Assistant periods of awareness of her heart, which were not necessarily borne out on her monitor. She did have some isolated PVCs on the monitor which could be explaining her symptoms.  PMHx:  Past Medical History  Diagnosis Date  . Epigastric pain   . Constipation   . Hyperlipidemia   . Gallstones     Recently diagnosed by Dr. Jarold Motto.  . Rash     Bruises easily, per medical history form dated 09/15/10    Past Surgical History  Procedure Laterality Date  . Rhinoplasty    . Cholecystectomy      FAMHx:  Family History  Problem Relation Age of Onset  . Diabetes Maternal Grandfather   . Heart disease Father   . Heart attack Father     SOCHx:   reports that she has never smoked. She has never used smokeless tobacco. She reports that she does not drink alcohol or use illicit  drugs.  ALLERGIES:  No Known Allergies  ROS: A comprehensive review of systems was negative except for: Constitutional: positive for fatigue Cardiovascular: positive for palpitations  HOME MEDS: Current Outpatient Prescriptions  Medication Sig Dispense Refill  . Omega 3-6-9 Fatty Acids (OMEGA 3-6-9 COMPLEX PO) Take 1 capsule by mouth daily.      . Prenatal Vit-Fe Sulfate-FA (PRENATAL VITAMIN PO) Take 1 tablet by mouth daily.        Current Facility-Administered Medications  Medication Dose Route Frequency Provider Last Rate Last Dose  . 0.9 %  sodium chloride infusion  500 mL Intravenous Continuous Mardella Layman, MD        LABS/IMAGING: No results found for this or any previous visit (from the past 48 hour(s)). No results found.  VITALS: BP 114/72  Pulse 64  Ht 5\' 10"  (1.778 m)  Wt 194 lb 6.4 oz (88.179 kg)  BMI 27.89 kg/m2  LMP 05/22/2012  EXAM: deferred  EKG: Deferred  ASSESSMENT: 1. Palpitations 2. Pregnancy  PLAN: 1.   Mrs. Kievit had isolated PVCs on her monitor. There are no concerning arrhythmias. Her symptoms occur mostly at night when she is at rest. Given the infrequency of her symptoms, I would not recommend medical therapy to suppress them at this time, especially since she is pregnant. I think we can readdress this issue after her delivery she continues to have palpitations. I think anxiety and stress are contributing to this as well. I  encouraged her to stay well hydrated during her pregnancy and as she becomes more pregnant to sleep on her left side and possible.  Thank you again for the kind referral.  Chrystie Nose, MD, [facility] Attending Cardiologist [facility]  Fredi Hurtado C 10/04/2012, 8:25 AM

## 2012-10-28 ENCOUNTER — Inpatient Hospital Stay (HOSPITAL_COMMUNITY)
Admission: AD | Admit: 2012-10-28 | Discharge: 2012-10-29 | Disposition: A | Discharge status: Home / Self Care | Payer: BC Managed Care – PPO | Source: Ambulatory Visit | Attending: Obstetrics and Gynecology | Admitting: Obstetrics and Gynecology

## 2012-10-28 ENCOUNTER — Encounter (HOSPITAL_COMMUNITY): Payer: Self-pay

## 2012-10-28 DIAGNOSIS — R102 Pelvic and perineal pain: Secondary | ICD-10-CM

## 2012-10-28 DIAGNOSIS — R109 Unspecified abdominal pain: Secondary | ICD-10-CM | POA: Insufficient documentation

## 2012-10-28 DIAGNOSIS — O368190 Decreased fetal movements, unspecified trimester, not applicable or unspecified: Secondary | ICD-10-CM | POA: Insufficient documentation

## 2012-10-28 HISTORY — DX: Gestational diabetes mellitus in pregnancy, unspecified control: O24.419

## 2012-10-28 HISTORY — DX: Type 2 diabetes mellitus without complications: E11.9

## 2012-10-28 LAB — URINALYSIS, ROUTINE W REFLEX MICROSCOPIC
Glucose, UA: NEGATIVE mg/dL
Ketones, ur: NEGATIVE mg/dL
Protein, ur: NEGATIVE mg/dL

## 2012-10-28 LAB — URINE MICROSCOPIC-ADD ON

## 2012-10-28 NOTE — MAU Note (Signed)
Pt states that yesterday and today has had DFM. States some cramping and some vag d/c, but not vag bleeding or LOF.

## 2012-10-29 DIAGNOSIS — N949 Unspecified condition associated with female genital organs and menstrual cycle: Secondary | ICD-10-CM

## 2012-10-29 DIAGNOSIS — O9989 Other specified diseases and conditions complicating pregnancy, childbirth and the puerperium: Secondary | ICD-10-CM

## 2012-10-29 LAB — WET PREP, GENITAL: Yeast Wet Prep HPF POC: NONE SEEN

## 2012-10-29 NOTE — MAU Provider Note (Signed)
History     CSN: [id number]  Arrival date and time: 10/28/12 2307   First Provider Initiated Contact with Patient 10/29/12 0016      Chief Complaint  Patient presents with  . Decreased Fetal Movement  . Abdominal Cramping   Abdominal Cramping    Judy [name] is a 32 y.o. G2P1000 at [redacted]w[redacted]d who presents today with decreased fetal movement and cramping. She states that she had not felt the baby move for 2 days, but she is feeling it now. She was in [place] all day today for a [employer] game, and was very busy and doing a lot of walking. At the end of the day she felt a lot of sharp pains on either side of her abdomen. She denies any LOR or VB.   Past Medical History  Diagnosis Date  . Epigastric pain   . Constipation   . Hyperlipidemia   . Gallstones     Recently diagnosed by Dr. Jarold Motto.  . Rash     Bruises easily, per medical history form dated 09/15/10  . Diabetes mellitus without complication   . Gestational diabetes     Past Surgical History  Procedure Laterality Date  . Rhinoplasty    . Cholecystectomy      Family History  Problem Relation Age of Onset  . Diabetes Maternal Grandfather   . Heart disease Father   . Heart attack Father     History  Substance Use Topics  . Smoking status: Never Smoker   . Smokeless tobacco: Never Used  . Alcohol Use: No    Allergies: No Known Allergies  Facility-administered medications prior to admission  Medication Dose Route Frequency Provider Last Rate Last Dose  . 0.9 %  sodium chloride infusion  500 mL Intravenous Continuous Mardella Layman, MD       Prescriptions prior to admission  Medication Sig Dispense Refill  . acetaminophen (TYLENOL) 325 MG tablet Take 650 mg by mouth every 6 (six) hours as needed for pain.      . Omega 3-6-9 Fatty Acids (OMEGA 3-6-9 COMPLEX PO) Take 1 capsule by mouth daily.      . Prenatal Vit-Fe Sulfate-FA (PRENATAL VITAMIN PO) Take 1 tablet by mouth daily.         ROS Physical  Exam   Blood pressure 115/68, pulse 84, temperature 97.9 F (36.6 C), temperature source Oral, resp. rate 18, height 5' 9.5" (1.765 m), weight 90.992 kg (200 lb 9.6 oz), last menstrual period 05/22/2012, SpO2 99.00%.  Physical Exam  Nursing note and vitals reviewed. Constitutional: She is oriented to person, place, and time. She appears well-developed and well-nourished. No distress.  Cardiovascular: Normal rate.   Respiratory: Effort normal.  GI: Soft. There is no tenderness.  Genitourinary:   External: no lesion Vagina: small amount of white discharge Cervix: pink, smooth, no CMT Uterus: AGA, FHT with doppler Toco: no UCs   Neurological: She is alert and oriented to person, place, and time.  Skin: Skin is warm and dry.  Psychiatric: She has a normal mood and affect.    MAU Course  Procedures  Results for orders placed during the hospital encounter of 10/28/12 (from the past 24 hour(s))  URINALYSIS, ROUTINE W REFLEX MICROSCOPIC     Status: Abnormal   Collection Time    10/28/12 11:17 PM      Result Value Range   Color, Urine YELLOW  YELLOW   APPearance CLEAR  CLEAR   Specific Gravity, Urine <1.005 (*) 1.005 -  1.030   pH 6.0  5.0 - 8.0   Glucose, UA NEGATIVE  NEGATIVE mg/dL   Hgb urine dipstick TRACE (*) NEGATIVE   Bilirubin Urine NEGATIVE  NEGATIVE   Ketones, ur NEGATIVE  NEGATIVE mg/dL   Protein, ur NEGATIVE  NEGATIVE mg/dL   Urobilinogen, UA 0.2  0.0 - 1.0 mg/dL   Nitrite NEGATIVE  NEGATIVE   Leukocytes, UA NEGATIVE  NEGATIVE  URINE MICROSCOPIC-ADD ON     Status: Abnormal   Collection Time    10/28/12 11:17 PM      Result Value Range   Squamous Epithelial / LPF FEW (*) RARE   WBC, UA 0-2  <3 WBC/hpf   RBC / HPF 0-2  <3 RBC/hpf   Bacteria, UA FEW (*) RARE  WET PREP, GENITAL     Status: Abnormal   Collection Time    10/29/12 12:20 AM      Result Value Range   Yeast Wet Prep HPF POC NONE SEEN  NONE SEEN   Trich, Wet Prep NONE SEEN  NONE SEEN   Clue Cells Wet  Prep HPF POC NONE SEEN  NONE SEEN   WBC, Wet Prep HPF POC FEW (*) NONE SEEN   0100: Spoke with Dr. Renaldo Fiddler okay for DC home.   Assessment and Plan   1. Pain of round ligament complicating pregnancy, antepartum    2nd trimester danger signs reviewed FU with the office as planned Return to MAU as needed.   Tawnya Crook 10/29/2012, 12:27 AM

## 2012-11-16 ENCOUNTER — Institutional Professional Consult (permissible substitution): Payer: BC Managed Care – PPO | Admitting: Cardiology

## 2013-01-04 ENCOUNTER — Encounter: Payer: BC Managed Care – PPO | Attending: Obstetrics and Gynecology

## 2013-01-04 VITALS — Ht 70.0 in | Wt 208.0 lb

## 2013-01-04 DIAGNOSIS — Z713 Dietary counseling and surveillance: Secondary | ICD-10-CM | POA: Insufficient documentation

## 2013-01-04 DIAGNOSIS — O99810 Abnormal glucose complicating pregnancy: Secondary | ICD-10-CM | POA: Insufficient documentation

## 2013-01-05 NOTE — Progress Notes (Signed)
  Patient was seen on 01/04/13 for Gestational Diabetes self-management class at the [facility]. The following learning objectives were met by the patient during this course:   States the definition of Gestational Diabetes  States why dietary management is important in controlling blood glucose  Describes the effects each nutrient has on blood glucose levels  Demonstrates ability to create a balanced meal plan  Demonstrates carbohydrate counting   States when to check blood glucose levels  Demonstrates proper blood glucose monitoring techniques  States the effect of stress and exercise on blood glucose levels  States the importance of limiting caffeine and abstaining from alcohol and smoking  Blood glucose monitor given:  One Touch Ultra Mini Self Monitoring Kit Lot # [id number] X Exp: 08/2013 Blood glucose reading: 105  Patient instructed to monitor glucose levels: FBS: 60 - <90 1 hour: <140 2 hour: <120  *Patient received handouts:  Nutrition Diabetes and Pregnancy  Carbohydrate Counting List  Patient will be seen for follow-up as needed.

## 2013-02-02 ENCOUNTER — Inpatient Hospital Stay (HOSPITAL_COMMUNITY)
Admission: AD | Admit: 2013-02-02 | Discharge: 2013-02-02 | Disposition: A | Discharge status: Home / Self Care | Payer: BC Managed Care – PPO | Source: Ambulatory Visit | Attending: Obstetrics and Gynecology | Admitting: Obstetrics and Gynecology

## 2013-02-02 ENCOUNTER — Encounter (HOSPITAL_COMMUNITY): Payer: Self-pay | Admitting: General Practice

## 2013-02-02 DIAGNOSIS — O4700 False labor before 37 completed weeks of gestation, unspecified trimester: Secondary | ICD-10-CM | POA: Insufficient documentation

## 2013-02-02 NOTE — MAU Note (Signed)
Patient states she is having contractions every 4 minutes but are not very painful like last labor, just regular. Denies bleeding or leaking and reports good fetal movement.

## 2013-02-14 ENCOUNTER — Encounter (HOSPITAL_COMMUNITY): Payer: Self-pay | Admitting: *Deleted

## 2013-02-14 ENCOUNTER — Encounter (HOSPITAL_COMMUNITY): Payer: BC Managed Care – PPO | Admitting: Anesthesiology

## 2013-02-14 ENCOUNTER — Inpatient Hospital Stay (HOSPITAL_COMMUNITY)
Admission: AD | Admit: 2013-02-14 | Discharge: 2013-02-16 | DRG: 775 | Disposition: A | Discharge status: Home / Self Care | Payer: BC Managed Care – PPO | Source: Ambulatory Visit | Attending: Obstetrics and Gynecology | Admitting: Obstetrics and Gynecology

## 2013-02-14 ENCOUNTER — Inpatient Hospital Stay (HOSPITAL_COMMUNITY): Payer: BC Managed Care – PPO | Admitting: Anesthesiology

## 2013-02-14 ENCOUNTER — Telehealth (HOSPITAL_COMMUNITY): Payer: Self-pay | Admitting: *Deleted

## 2013-02-14 DIAGNOSIS — O99814 Abnormal glucose complicating childbirth: Principal | ICD-10-CM | POA: Diagnosis present

## 2013-02-14 DIAGNOSIS — Z794 Long term (current) use of insulin: Secondary | ICD-10-CM

## 2013-02-14 LAB — CBC
HCT: 38.3 % (ref 36.0–46.0)
MCH: 30.5 pg (ref 26.0–34.0)
MCHC: 35 g/dL (ref 30.0–36.0)
MCV: 87 fL (ref 78.0–100.0)
RDW: 14.4 % (ref 11.5–15.5)

## 2013-02-14 MED ORDER — OXYCODONE-ACETAMINOPHEN 5-325 MG PO TABS: 1.0000 | ORAL_TABLET | ORAL | Status: DC | PRN

## 2013-02-14 MED ORDER — PHENYLEPHRINE 40 MCG/ML (10ML) SYRINGE FOR IV PUSH (FOR BLOOD PRESSURE SUPPORT)
80.0000 ug | PREFILLED_SYRINGE | INTRAVENOUS | Status: DC | PRN
  Filled 2013-02-14: qty 10
  Filled 2013-02-14: qty 2

## 2013-02-14 MED ORDER — CITRIC ACID-SODIUM CITRATE 334-500 MG/5ML PO SOLN: 30.0000 mL | ORAL | Status: DC | PRN

## 2013-02-14 MED ORDER — SENNOSIDES-DOCUSATE SODIUM 8.6-50 MG PO TABS
2.0000 | ORAL_TABLET | ORAL | Status: DC
  Administered 2013-02-14 – 2013-02-16 (×2): 2 via ORAL
  Filled 2013-02-14 (×2): qty 2

## 2013-02-14 MED ORDER — DIPHENHYDRAMINE HCL 25 MG PO CAPS: 25.0000 mg | ORAL_CAPSULE | Freq: Four times a day (QID) | ORAL | Status: DC | PRN

## 2013-02-14 MED ORDER — TETANUS-DIPHTH-ACELL PERTUSSIS 5-2.5-18.5 LF-MCG/0.5 IM SUSP: 0.5000 mL | Freq: Once | INTRAMUSCULAR | Status: DC

## 2013-02-14 MED ORDER — EPHEDRINE 5 MG/ML INJ
10.0000 mg | INTRAVENOUS | Status: DC | PRN
  Filled 2013-02-14: qty 2
  Filled 2013-02-14: qty 4

## 2013-02-14 MED ORDER — ZOLPIDEM TARTRATE 5 MG PO TABS: 5.0000 mg | ORAL_TABLET | Freq: Every evening | ORAL | Status: DC | PRN

## 2013-02-14 MED ORDER — BENZOCAINE-MENTHOL 20-0.5 % EX AERO
1.0000 "application " | INHALATION_SPRAY | CUTANEOUS | Status: DC | PRN
  Administered 2013-02-14: 1 via TOPICAL
  Filled 2013-02-14: qty 56

## 2013-02-14 MED ORDER — IBUPROFEN 600 MG PO TABS
600.0000 mg | ORAL_TABLET | Freq: Four times a day (QID) | ORAL | Status: DC | PRN
  Administered 2013-02-14: 600 mg via ORAL
  Filled 2013-02-14: qty 1

## 2013-02-14 MED ORDER — DIBUCAINE 1 % RE OINT: 1.0000 "application " | TOPICAL_OINTMENT | RECTAL | Status: DC | PRN

## 2013-02-14 MED ORDER — DIPHENHYDRAMINE HCL 50 MG/ML IJ SOLN: 12.5000 mg | INTRAMUSCULAR | Status: DC | PRN

## 2013-02-14 MED ORDER — OXYTOCIN 40 UNITS IN LACTATED RINGERS INFUSION - SIMPLE MED
62.5000 mL/h | INTRAVENOUS | Status: DC
  Filled 2013-02-14: qty 1000

## 2013-02-14 MED ORDER — ONDANSETRON HCL 4 MG/2ML IJ SOLN: 4.0000 mg | Freq: Four times a day (QID) | INTRAMUSCULAR | Status: DC | PRN

## 2013-02-14 MED ORDER — EPHEDRINE 5 MG/ML INJ
10.0000 mg | INTRAVENOUS | Status: DC | PRN
  Filled 2013-02-14: qty 2

## 2013-02-14 MED ORDER — PHENYLEPHRINE 40 MCG/ML (10ML) SYRINGE FOR IV PUSH (FOR BLOOD PRESSURE SUPPORT)
80.0000 ug | PREFILLED_SYRINGE | INTRAVENOUS | Status: DC | PRN
  Filled 2013-02-14: qty 2

## 2013-02-14 MED ORDER — ONDANSETRON HCL 4 MG PO TABS: 4.0000 mg | ORAL_TABLET | ORAL | Status: DC | PRN

## 2013-02-14 MED ORDER — ONDANSETRON HCL 4 MG/2ML IJ SOLN: 4.0000 mg | INTRAMUSCULAR | Status: DC | PRN

## 2013-02-14 MED ORDER — LIDOCAINE HCL (PF) 1 % IJ SOLN
INTRAMUSCULAR | Status: DC | PRN
  Administered 2013-02-14 (×2): 5 mL

## 2013-02-14 MED ORDER — BISACODYL 10 MG RE SUPP: 10.0000 mg | Freq: Every day | RECTAL | Status: DC | PRN

## 2013-02-14 MED ORDER — WITCH HAZEL-GLYCERIN EX PADS: 1.0000 "application " | MEDICATED_PAD | CUTANEOUS | Status: DC | PRN

## 2013-02-14 MED ORDER — FENTANYL 2.5 MCG/ML BUPIVACAINE 1/10 % EPIDURAL INFUSION (WH - ANES)
INTRAMUSCULAR | Status: DC | PRN
  Administered 2013-02-14: 14 mL/h via EPIDURAL

## 2013-02-14 MED ORDER — ACETAMINOPHEN 325 MG PO TABS: 650.0000 mg | ORAL_TABLET | ORAL | Status: DC | PRN

## 2013-02-14 MED ORDER — LANOLIN HYDROUS EX OINT: TOPICAL_OINTMENT | CUTANEOUS | Status: DC | PRN

## 2013-02-14 MED ORDER — PRENATAL MULTIVITAMIN CH
1.0000 | ORAL_TABLET | Freq: Every day | ORAL | Status: DC
  Administered 2013-02-15 – 2013-02-16 (×2): 1 via ORAL
  Filled 2013-02-14 (×2): qty 1

## 2013-02-14 MED ORDER — FLEET ENEMA 7-19 GM/118ML RE ENEM: 1.0000 | ENEMA | Freq: Every day | RECTAL | Status: DC | PRN

## 2013-02-14 MED ORDER — LIDOCAINE HCL (PF) 1 % IJ SOLN
30.0000 mL | INTRAMUSCULAR | Status: DC | PRN
  Administered 2013-02-14: 30 mL via SUBCUTANEOUS
  Filled 2013-02-14 (×2): qty 30

## 2013-02-14 MED ORDER — IBUPROFEN 600 MG PO TABS
600.0000 mg | ORAL_TABLET | Freq: Four times a day (QID) | ORAL | Status: DC
  Administered 2013-02-14 – 2013-02-16 (×8): 600 mg via ORAL
  Filled 2013-02-14 (×8): qty 1

## 2013-02-14 MED ORDER — SIMETHICONE 80 MG PO CHEW: 80.0000 mg | CHEWABLE_TABLET | ORAL | Status: DC | PRN

## 2013-02-14 MED ORDER — LACTATED RINGERS IV SOLN
500.0000 mL | Freq: Once | INTRAVENOUS | Status: AC
  Administered 2013-02-14: 500 mL via INTRAVENOUS

## 2013-02-14 MED ORDER — LACTATED RINGERS IV SOLN: 500.0000 mL | INTRAVENOUS | Status: DC | PRN

## 2013-02-14 MED ORDER — FENTANYL 2.5 MCG/ML BUPIVACAINE 1/10 % EPIDURAL INFUSION (WH - ANES)
14.0000 mL/h | INTRAMUSCULAR | Status: DC | PRN
  Filled 2013-02-14: qty 125

## 2013-02-14 MED ORDER — FLEET ENEMA 7-19 GM/118ML RE ENEM: 1.0000 | ENEMA | RECTAL | Status: DC | PRN

## 2013-02-14 MED ORDER — OXYTOCIN BOLUS FROM INFUSION
500.0000 mL | INTRAVENOUS | Status: DC
  Administered 2013-02-14: 500 mL via INTRAVENOUS

## 2013-02-14 MED ORDER — LACTATED RINGERS IV SOLN
INTRAVENOUS | Status: DC
  Administered 2013-02-14: 13:00:00 via INTRAVENOUS

## 2013-02-14 NOTE — Anesthesia Preprocedure Evaluation (Addendum)
Anesthesia Evaluation  Patient identified by MRN, date of birth, ID band Patient awake    Reviewed: Allergy & Precautions, H&P , Patient's Chart, lab work & pertinent test results  Airway Mallampati: II  TM Distance: >3 FB Neck ROM: full    Dental   Pulmonary  breath sounds clear to auscultation        Cardiovascular Rhythm:regular Rate:Normal     Neuro/Psych  Headaches,    GI/Hepatic   Endo/Other  diabetes  Renal/GU      Musculoskeletal   Abdominal   Peds  Hematology   Anesthesia Other Findings   Reproductive/Obstetrics (+) Pregnancy                             Anesthesia Physical Anesthesia Plan  ASA: III  Anesthesia Plan: Epidural   Post-op Pain Management:    Induction:   Airway Management Planned:   Additional Equipment:   Intra-op Plan:   Post-operative Plan:   Informed Consent: I have reviewed the patients History and Physical, chart, labs and discussed the procedure including the risks, benefits and alternatives for the proposed anesthesia with the patient or authorized representative who has indicated his/her understanding and acceptance.     Plan Discussed with:   Anesthesia Plan Comments:         Anesthesia Quick Evaluation  

## 2013-02-14 NOTE — H&P (Signed)
Judy [name] is a 32 y.o. female at 12.5 with onset of labor.  Gestational diabetes requiring glyburide.  Negative GBS   Maternal Medical History:  Reason for admission: Contractions.   Contractions: Onset was 1-2 hours ago.   Frequency: regular.   Perceived severity is moderate.    Fetal activity: Perceived fetal activity is normal.    Prenatal complications: Gestational diabetes requiring glyberide  Prenatal Complications - Diabetes: gestational. Diabetes is managed by oral agent (monotherapy).      OB History   Grav Para Term Preterm Abortions TAB SAB Ect Mult Living   4 1 1  2 1 1   1      Past Medical History  Diagnosis Date  . Epigastric pain   . Constipation   . Hyperlipidemia   . Gallstones     Recently diagnosed by Dr. Jarold Motto.  . Rash     Bruises easily, per medical history form dated 09/15/10  . Diabetes mellitus without complication   . Gestational diabetes   . Hx of varicella   . ZOXWRUEA(540.9)    Past Surgical History  Procedure Laterality Date  . Rhinoplasty    . Cholecystectomy     Family History: family history includes Diabetes in her maternal aunt and maternal grandfather; Fibromyalgia in her maternal aunt; Heart attack in her father; Heart disease in her father. Social History:  reports that she has never smoked. She has never used smokeless tobacco. She reports that she does not drink alcohol or use illicit drugs.   Prenatal Transfer Tool  Maternal Diabetes: Yes:  Diabetes Type:  Insulin/Medication controlled Genetic Screening: Normal Maternal Ultrasounds/Referrals: Normal Fetal Ultrasounds or other Referrals:  None Maternal Substance Abuse:  No Significant Maternal Medications:  None Significant Maternal Lab Results:  None Other Comments:  None  ROS  Dilation: 6 Effacement (%): 90 Station: -2 Exam by:: CRoxan Hockey RN Blood pressure 138/80, pulse 104, temperature 97.8 F (36.6 C), temperature source Oral, resp. rate 20, height 5'  10.5" (1.791 m), weight 97.796 kg (215 lb 9.6 oz). Maternal Exam:  Uterine Assessment: Contraction strength is moderate.  Contraction frequency is regular.   Abdomen: Patient reports no abdominal tenderness. Fundal height is c/w dates.   Estimated fetal weight is 7.   Fetal presentation: vertex  Cervix: 6 cm 80 %  Physical Exam  Prenatal labs: ABO, Rh: A/Negative/-- (04/12 0000) Antibody: Negative (04/12 0000) Rubella: Immune (04/12 0000) RPR: Nonreactive (04/12 0000)  HBsAg: Negative (04/12 0000)  HIV: Non-reactive (04/12 0000)  GBS: Negative (11/12 0000)   Assessment/Plan: Intrauterine pregnancy at 37.5 Gestational diabetes on glyburide Plan epidural and arom   Truett Mcfarlan S 02/14/2013, 1:18 PM

## 2013-02-14 NOTE — MAU Note (Signed)
Patient states she was in the office at 0900 and was 5 cm. Had membranes swept and now has a little bloody show with contractions every 3 minutes. Was 4cm last week. Reports good fetal movement, no watery leaking.

## 2013-02-14 NOTE — Anesthesia Procedure Notes (Signed)
Epidural Patient location during procedure: OB Start time: 02/14/2013 1:33 PM  Staffing Anesthesiologist: Brayton Caves Performed by: anesthesiologist   Preanesthetic Checklist Completed: patient identified, site marked, surgical consent, pre-op evaluation, timeout performed, IV checked, risks and benefits discussed and monitors and equipment checked  Epidural Patient position: sitting Prep: site prepped and draped and DuraPrep Patient monitoring: continuous pulse ox and blood pressure Approach: midline Injection technique: LOR air  Needle:  Needle type: Tuohy  Needle gauge: 17 G Needle length: 9 cm and 9 Needle insertion depth: 5 cm cm Catheter type: closed end flexible Catheter size: 19 Gauge Catheter at skin depth: 10 cm Test dose: negative  Assessment Events: blood not aspirated, injection not painful, no injection resistance, negative IV test and no paresthesia  Additional Notes Patient identified.  Risk benefits discussed including failed block, incomplete pain control, headache, nerve damage, paralysis, blood pressure changes, nausea, vomiting, reactions to medication both toxic or allergic, and postpartum back pain.  Patient expressed understanding and wished to proceed.  All questions were answered.  Sterile technique used throughout procedure and epidural site dressed with sterile barrier dressing. No paresthesia or other complications noted.The patient did not experience any signs of intravascular injection such as tinnitus or metallic taste in mouth nor signs of intrathecal spread such as rapid motor block. Please see nursing notes for vital signs.

## 2013-02-14 NOTE — Telephone Encounter (Signed)
Preadmission screen  

## 2013-02-15 LAB — CBC
HCT: 35.7 % — ABNORMAL LOW (ref 36.0–46.0)
Hemoglobin: 12.4 g/dL (ref 12.0–15.0)
MCHC: 34.7 g/dL (ref 30.0–36.0)
RBC: 4.05 MIL/uL (ref 3.87–5.11)
RDW: 14.7 % (ref 11.5–15.5)

## 2013-02-15 MED ORDER — RHO D IMMUNE GLOBULIN 1500 UNIT/2ML IJ SOLN
300.0000 ug | Freq: Once | INTRAMUSCULAR | Status: AC
  Administered 2013-02-15: 300 ug via INTRAMUSCULAR
  Filled 2013-02-15: qty 2

## 2013-02-15 NOTE — Anesthesia Postprocedure Evaluation (Signed)
Anesthesia Post Note  Patient: Judy [name]  Procedure(s) Performed: * No procedures listed *  Anesthesia type: Epidural  Patient location: Mother/Baby  Post pain: Pain level controlled  Post assessment: Post-op Vital signs reviewed  Last Vitals:  Filed Vitals:   02/15/13 0626  BP: 108/69  Pulse: 76  Temp: 36.4 C  Resp: 18    Post vital signs: Reviewed  Level of consciousness:alert  Complications: No apparent anesthesia complications

## 2013-02-15 NOTE — Lactation Note (Signed)
This note was copied from the chart of Judy [name]. Lactation Consultation Note    Initial consult with this mom of a 37 6/7 weeks corrected gestation, and 22 hours pot partum. Eliberto Ivory came to the NICU at 9 hours of life due to a choking and desat episode. Mom began pumping this morning, and I also showed her how to hand express. Mom has not expressed more than a drop of colostrum so far, and reports a very low milk supply with her first child.Teaching done on providing EBM for a NICU baby. Mom has a Medela DEP at home, which she used with her first baby.  I assisted mom with latching Eliberto Ivory - he initially was extremely upset, and with skin to skin, after 10 minutes , calmed and latched  Easily, he then fell asleep, no cuking, and mom did skin to skin for 30 minutes. I will follow this family in the NICU.  Patient Name: Judy Aliene Tamura [id number] Date: 02/15/2013 Reason for consult: Initial assessment;NICU baby   Maternal Data Formula Feeding for Exclusion: Yes (baby in NICU) Infant to breast within first hour of birth: Yes Has patient been taught Hand Expression?: Yes Does the patient have breastfeeding experience prior to this delivery?: Yes  Feeding Feeding Type: Breast Fed  LATCH Score/Interventions Latch: Grasps breast easily, tongue down, lips flanged, rhythmical sucking. Intervention(s): Adjust position;Assist with latch;Breast massage;Breast compression  Audible Swallowing: None Intervention(s): Skin to skin;Hand expression  Type of Nipple: Flat  Comfort (Breast/Nipple): Soft / non-tender     Hold (Positioning): Assistance needed to correctly position infant at breast and maintain latch. Intervention(s): Breastfeeding basics reviewed;Support Pillows;Position options;Skin to skin  LATCH Score: 6  Lactation Tools Discussed/Used Tools: Pump Breast pump type: Double-Electric Breast Pump WIC Program: No Pump Review: Setup, frequency, and cleaning;Milk Storage;Other  (comment) (hand expression taught, pumping for NICU baby  teaching done) Initiated by:: c Kiefer Opheim rn lc Date initiated:: 02/15/13   Consult Status Consult Status: Follow-up Date: 02/16/13 Follow-up type: In-patient    Alfred Levins 02/15/2013, 1:26 PM

## 2013-02-15 NOTE — Progress Notes (Signed)
Post Partum Day 1 Subjective: up ad lib, voiding, tolerating PO and baby transferred to NICU secondary to grunting and desaturating  Objective: Blood pressure 108/69, pulse 76, temperature 97.6 F (36.4 C), temperature source Oral, resp. rate 18, height 5' 10.5" (1.791 m), weight 215 lb 9.6 oz (97.796 kg), SpO2 94.00%, unknown if currently breastfeeding.  Physical Exam:  General: alert and cooperative Lochia: appropriate Uterine Fundus: firm Incision: perineum intact DVT Evaluation: No evidence of DVT seen on physical exam. Negative Homan's sign. No cords or calf tenderness. No significant calf/ankle edema.   Recent Labs  02/14/13 1253 02/15/13 0555  HGB 13.4 12.4  HCT 38.3 35.7*    Assessment/Plan: Plan for discharge tomorrow   LOS: 1 day   Judy [name] G 02/15/2013, 8:34 AM

## 2013-02-16 LAB — RH IG WORKUP (INCLUDES ABO/RH)
ABO/RH(D): A NEG
Fetal Screen: NEGATIVE
Gestational Age(Wks): 37.5

## 2013-02-16 MED ORDER — IBUPROFEN 600 MG PO TABS: 600.0000 mg | ORAL_TABLET | Freq: Four times a day (QID) | ORAL | Status: DC

## 2013-02-16 NOTE — Discharge Summary (Signed)
Obstetric Discharge Summary Reason for Admission: onset of labor Prenatal Procedures: none Intrapartum Procedures: spontaneous vaginal delivery Postpartum Procedures: none Complications-Operative and Postpartum: none Hemoglobin  Date Value Range Status  02/15/2013 12.4  12.0 - 15.0 g/dL Final     HCT  Date Value Range Status  02/15/2013 35.7* 36.0 - 46.0 % Final    Physical Exam:  General: alert, cooperative and appears stated age Lochia: appropriate Uterine Fundus: firm Incision: perineum intact DVT Evaluation: No evidence of DVT seen on physical exam. Negative Homan's sign. No cords or calf tenderness.  Discharge Diagnoses: Term Pregnancy-delivered  Discharge Information: Date: 02/16/2013 Activity: pelvic rest Diet: routine Medications: None and Ibuprofen Condition: stable Instructions: refer to practice specific booklet Discharge to: home   Newborn Data: Live born female  Birth Weight: 8 lb 8.2 oz (3861 g) APGAR: 8, 9  Home with mother.  Dayanis Bergquist 02/16/2013, 2:28 PM

## 2013-02-16 NOTE — Progress Notes (Signed)
Post Partum Day 2 Subjective: no complaints, up ad lib, voiding and tolerating PO.  Baby in NICU.  Patient would like to room in.    Objective: Blood pressure 126/75, pulse 77, temperature 97.8 F (36.6 C), temperature source Oral, resp. rate 18, height 5' 10.5" (1.791 m), weight 215 lb 9.6 oz (97.796 kg), SpO2 94.00%, unknown if currently breastfeeding.  Physical Exam:  General: alert, cooperative and appears stated age Lochia: appropriate Uterine Fundus: firm Incision: healing well, no significant drainage, no dehiscence DVT Evaluation: No evidence of DVT seen on physical exam. Negative Homan's sign. No cords or calf tenderness.   Recent Labs  02/14/13 1253 02/15/13 0555  HGB 13.4 12.4  HCT 38.3 35.7*    Assessment/Plan: Discharge home and Breastfeeding Patient will call insurance to determine if she will room in or be discharged today.     LOS: 2 days   Diamonique Ruedas 02/16/2013, 9:13 AM

## 2013-02-21 ENCOUNTER — Ambulatory Visit: Payer: Self-pay

## 2013-02-21 NOTE — Lactation Note (Signed)
This note was copied from the chart of Judy [name]. Lactation Consultation Note   Brief follow up consult with this mom of a term baby, now 34 week old, going home today. Mom was bottle feeding formula, but does have EBM at home. The baby was circumcised today, and was sleepy, so mom did not try to breast feed. She rports her gets very sleepy with breast feeding. I again told her to cal for an outpatient lactation consult as needed, and to call for any questions/concerns.  Patient Name: Judy Fenix Ruppe MVHQI'O Date: 02/21/2013     Maternal Data    Feeding    LATCH Score/Interventions                      Lactation Tools Discussed/Used     Consult Status      Alfred Levins 02/21/2013, 5:36 PM

## 2013-02-23 ENCOUNTER — Inpatient Hospital Stay (HOSPITAL_COMMUNITY): Admission: RE | Admit: 2013-02-23 | Payer: BC Managed Care – PPO | Source: Ambulatory Visit

## 2013-02-24 ENCOUNTER — Ambulatory Visit (HOSPITAL_COMMUNITY)
Admission: RE | Admit: 2013-02-24 | Discharge: 2013-02-24 | Disposition: A | Discharge status: Home / Self Care | Payer: BC Managed Care – PPO | Source: Ambulatory Visit | Attending: Obstetrics and Gynecology | Admitting: Obstetrics and Gynecology

## 2013-02-24 NOTE — Lactation Note (Signed)
Infant Lactation Consultation Outpatient Visit Note   Mom is here today with Judy [name] who was discharged from NICU on Tuesday.  She reports that her MS decreased dramatically on Wednesday.  She has been pumping every 2-3 hours.  She expressed 50 ml this morning and when she pumped here she expressed 20ml.  She reports that as the day goes on she yields less and less.  I suspect that her supply issues may be hormonally driven.  SHe has a history of infertility, and GDM.  She denies PCOS.  The plan is for her to continue pumping, have her thyroid checked and discuss possibly using metformin to increase her milk supply.  Judy [name] will receive any expressed BM plus formula so that he is properly nourished. Patient Name: Judy [name] Date of Birth: 11/18/1980 Birth Weight:  8#8oz Gestational Age at Delivery: Gestational Age: <None>37 5/7 Type of Delivery: Vag  Breastfeeding History Frequency of Breastfeeding:  Length of Feeding:  Voids:  Stools:   Supplementing / Method: Pumping:  Type of Pump:   Frequency:  Volume:    Comments:    Consultation Evaluation:  Initial Feeding Assessment: Pre-feed [id number] Post-feed XBJYNW:[id number]  Amount Transferred:0 Comments: Breast compression used to stimulate sucking.  A few swallows were heard but nothing was transferred.  Mom bottle fed [place] 45 ml. Using the paced feeding technique.  Comments:  Total Breast milk Transferred this Visit: 0 Total Supplement Given: 45 ml  Additional Interventions:   Follow-Up  With physician for throid check/ and exploration of using metformin to help with milk supply      Soyla Dryer 02/24/2013, 10:57 AM

## 2014-01-22 ENCOUNTER — Encounter (HOSPITAL_COMMUNITY): Payer: Self-pay | Admitting: *Deleted

## 2014-03-20 ENCOUNTER — Ambulatory Visit (INDEPENDENT_AMBULATORY_CARE_PROVIDER_SITE_OTHER): Payer: BC Managed Care – PPO | Admitting: Internal Medicine

## 2014-03-20 ENCOUNTER — Encounter: Payer: Self-pay | Admitting: Internal Medicine

## 2014-03-20 VITALS — BP 108/82 | HR 88 | Temp 98.0°F

## 2014-03-20 DIAGNOSIS — J0100 Acute maxillary sinusitis, unspecified: Secondary | ICD-10-CM | POA: Diagnosis not present

## 2014-03-20 DIAGNOSIS — Z23 Encounter for immunization: Secondary | ICD-10-CM

## 2014-03-20 DIAGNOSIS — J029 Acute pharyngitis, unspecified: Secondary | ICD-10-CM | POA: Diagnosis not present

## 2014-03-20 MED ORDER — LEVOFLOXACIN 500 MG PO TABS: 500.0000 mg | ORAL_TABLET | Freq: Every day | ORAL | Status: DC

## 2014-03-20 NOTE — Progress Notes (Signed)
   Subjective:    Patient ID: Judy [name], female    DOB: 06/17/1980, 33 y.o.   MRN: [id number]  HPI  33 year old White Female with 2 week history of URI symptoms. Has had sore throat, postnasal drip and recently left maxillary sinus pain. She has 2 children, 7948-month-old and a 32-year-old. In November, the 32-year-old has strep throat. No fever or shaking chills.    Review of Systems     Objective:   Physical Exam  Parents slightly injected without exudate. Rapid strep screen negative. TMs are full bilaterally but not red. Neck is supple. Chest clear to auscultation.      Assessment & Plan:  Acute left maxillary sinusitis  Pharyngitis-non-strep  Plan: Levaquin 500 milligrams daily for 10 days. Take with food.

## 2014-03-20 NOTE — Patient Instructions (Addendum)
Flu vaccine given. Levaquin 500 milligrams daily for 10 days.

## 2014-03-23 NOTE — L&D Delivery Note (Signed)
Delivery Note  SVD viable female Apgars 8,8 per NICU team in attendance over intact perineum.  Placenta delivered spontaneously intact with 3VC. Good support and hemostasis noted and R/V exam confirms.  PH art was sent.  [place] cord blood was not done.  Mother and baby were doing well.  EBL 200cc  Candice Campavid Mikhala Kenan, MD

## 2014-04-27 ENCOUNTER — Other Ambulatory Visit: Payer: Self-pay | Admitting: Obstetrics and Gynecology

## 2014-04-30 LAB — CYTOLOGY - PAP

## 2014-06-28 LAB — OB RESULTS CONSOLE GC/CHLAMYDIA
Chlamydia: NEGATIVE
Gonorrhea: NEGATIVE

## 2014-06-28 LAB — OB RESULTS CONSOLE HIV ANTIBODY (ROUTINE TESTING): HIV: NONREACTIVE

## 2014-06-28 LAB — OB RESULTS CONSOLE RPR: RPR: NONREACTIVE

## 2014-06-28 LAB — OB RESULTS CONSOLE RUBELLA ANTIBODY, IGM: RUBELLA: IMMUNE

## 2014-06-28 LAB — OB RESULTS CONSOLE HEPATITIS B SURFACE ANTIGEN: Hepatitis B Surface Ag: NEGATIVE

## 2014-06-28 LAB — OB RESULTS CONSOLE ABO/RH: RH Type: NEGATIVE

## 2014-07-10 ENCOUNTER — Ambulatory Visit (INDEPENDENT_AMBULATORY_CARE_PROVIDER_SITE_OTHER): Payer: 59 | Admitting: Internal Medicine

## 2014-07-10 ENCOUNTER — Encounter: Payer: Self-pay | Admitting: Internal Medicine

## 2014-07-10 VITALS — BP 116/70 | HR 87 | Temp 97.9°F | Wt 197.0 lb

## 2014-07-10 DIAGNOSIS — Z3490 Encounter for supervision of normal pregnancy, unspecified, unspecified trimester: Secondary | ICD-10-CM

## 2014-07-10 DIAGNOSIS — J069 Acute upper respiratory infection, unspecified: Secondary | ICD-10-CM | POA: Diagnosis not present

## 2014-07-10 LAB — CBC WITH DIFFERENTIAL/PLATELET
BASOS ABS: 0 10*3/uL (ref 0.0–0.1)
Basophils Relative: 0 % (ref 0–1)
Eosinophils Absolute: 0.2 10*3/uL (ref 0.0–0.7)
Eosinophils Relative: 2 % (ref 0–5)
HEMATOCRIT: 40.8 % (ref 36.0–46.0)
Hemoglobin: 14.1 g/dL (ref 12.0–15.0)
LYMPHS PCT: 22 % (ref 12–46)
Lymphs Abs: 2.7 10*3/uL (ref 0.7–4.0)
MCH: 29.4 pg (ref 26.0–34.0)
MCHC: 34.6 g/dL (ref 30.0–36.0)
MCV: 85.2 fL (ref 78.0–100.0)
MONO ABS: 0.6 10*3/uL (ref 0.1–1.0)
MPV: 9.9 fL (ref 8.6–12.4)
Monocytes Relative: 5 % (ref 3–12)
NEUTROS PCT: 71 % (ref 43–77)
Neutro Abs: 8.8 10*3/uL — ABNORMAL HIGH (ref 1.7–7.7)
PLATELETS: 260 10*3/uL (ref 150–400)
RBC: 4.79 MIL/uL (ref 3.87–5.11)
RDW: 13.5 % (ref 11.5–15.5)
WBC: 12.4 10*3/uL — AB (ref 4.0–10.5)

## 2014-07-10 MED ORDER — CEFTRIAXONE SODIUM 1 G IJ SOLR
1.0000 g | Freq: Once | INTRAMUSCULAR | Status: AC
  Administered 2014-07-10: 1 g via INTRAMUSCULAR

## 2014-07-10 MED ORDER — ALBUTEROL SULFATE HFA 108 (90 BASE) MCG/ACT IN AERS: INHALATION_SPRAY | RESPIRATORY_TRACT | Status: DC

## 2014-07-10 MED ORDER — AZITHROMYCIN 250 MG PO TABS: ORAL_TABLET | ORAL | Status: DC

## 2014-07-10 NOTE — Progress Notes (Signed)
   Subjective:    Patient ID: Judy [name], female    DOB: 08/27/1980, 34 y.o.   MRN: [id number]  HPI  34 year old Female who is proximally [redacted] weeks pregnant with her third child. Has appointment to see OB/GYN, Dr. Renaldo FiddlerAdkins Friday, April 22. Has had cough for 3-1/2 weeks. Noticed that she was extremely fatigued particular with going up steps and slightly short of breath. No documented fever or shaking chills. Not much sputum production. Says cough can get pretty bad at times.    Review of Systems     Objective:   Physical Exam  Skin warm and dry. Nodes none. TMs are clear. Pharynx is clear without exudate. Neck is supple. Chest remarkable for inspiratory wheezing right chest and loud rhonchi/rales right lower lobe      Assessment & Plan:  Right lower lobe pneumonia  Plan: Zithromax Z-Pak take 2 tablets day one followed by 1 tablet days 2 through 5. Follow-up here in one week. Rocephin 1 g IM given in office today. Ventolin HFA inhaler 2 sprays by mouth 4 times a day for cough and shortness of breath. CBC with differential pending. Note sent to GYN.

## 2014-07-10 NOTE — Patient Instructions (Addendum)
Rocephin 1 g IM given. Zithromax Z-PAK take as directed. Return in one week or sooner if worse. Ventolin inhaler 2 sprays by mouth 4 times a day. Rest and drink plenty of fluids.

## 2014-07-20 ENCOUNTER — Ambulatory Visit (INDEPENDENT_AMBULATORY_CARE_PROVIDER_SITE_OTHER): Payer: 59 | Admitting: Internal Medicine

## 2014-07-20 ENCOUNTER — Encounter: Payer: Self-pay | Admitting: Internal Medicine

## 2014-07-20 VITALS — BP 104/74 | HR 72 | Temp 97.4°F | Wt 198.5 lb

## 2014-07-20 DIAGNOSIS — Z331 Pregnant state, incidental: Secondary | ICD-10-CM

## 2014-07-20 DIAGNOSIS — J189 Pneumonia, unspecified organism: Secondary | ICD-10-CM | POA: Diagnosis not present

## 2014-07-20 DIAGNOSIS — J181 Lobar pneumonia, unspecified organism: Principal | ICD-10-CM

## 2014-07-20 DIAGNOSIS — Z3491 Encounter for supervision of normal pregnancy, unspecified, first trimester: Secondary | ICD-10-CM

## 2014-07-20 NOTE — Patient Instructions (Signed)
Continue to rest and drink plenty of fluids. Call if cough does not resolve in 7-10 days or sooner for

## 2014-07-20 NOTE — Progress Notes (Signed)
   Subjective:    Patient ID: Judy [name], female    DOB: 12/29/1980, 34 y.o.   MRN: [id number]  HPI  For follow-up of right lower lobe pneumonia. She is pregnant and could not receive x-ray when initially seen on April 19. She was prescribed Zithromax Z-PAK and a Ventolin inhaler. She's feeling much better but still fatigued. She has 2 small children. Husband recently had gastric fundoplication and has been unable to lift children. Still has a little bit of cough but it is not productive. No fever. Had a checkup with GYN on week ago.    Review of Systems     Objective:   Physical Exam  Skin warm and dry. Nodes none. TMs and pharynx are clear. Neck supple without adenopathy. Chest clear to auscultation.      Assessment & Plan:  Resolving right lower lobe pneumonia  Intrauterine pregnancy proximally 11 weeks  Plan: Have not refilled antibiotic at this point in time since I think she is much improved. She will call if cough does not resolve within the next 7-10 days or sooner if worse.

## 2014-09-18 ENCOUNTER — Encounter: Payer: Self-pay | Admitting: Internal Medicine

## 2014-09-18 ENCOUNTER — Ambulatory Visit (INDEPENDENT_AMBULATORY_CARE_PROVIDER_SITE_OTHER): Payer: 59 | Admitting: Internal Medicine

## 2014-09-18 VITALS — BP 118/80 | HR 95 | Temp 97.3°F | Wt 200.5 lb

## 2014-09-18 DIAGNOSIS — H66003 Acute suppurative otitis media without spontaneous rupture of ear drum, bilateral: Secondary | ICD-10-CM

## 2014-09-18 DIAGNOSIS — Z3490 Encounter for supervision of normal pregnancy, unspecified, unspecified trimester: Secondary | ICD-10-CM

## 2014-09-18 DIAGNOSIS — J320 Chronic maxillary sinusitis: Secondary | ICD-10-CM

## 2014-09-18 DIAGNOSIS — H6503 Acute serous otitis media, bilateral: Secondary | ICD-10-CM

## 2014-09-18 MED ORDER — AZITHROMYCIN 250 MG PO TABS: ORAL_TABLET | ORAL | Status: DC

## 2014-09-18 NOTE — Progress Notes (Signed)
   Subjective:    Patient ID: Judy [name], female    DOB: 09/09/1980, 34 y.o.   MRN: [id number]  HPI Patient is now [redacted] weeks pregnant. She's having a boy. For about a week and a half she's experienced some left maxillary sinus pain. Doesn't think it's a dental issue. She's not had any cough or cold. No sore throat. No fever or chills. No discolored nasal drainage.   Review of Systems see above     Objective:   Physical Exam Skin warm and dry. Nodes none. TMs are full bilaterally but not red. Left pharynx is injected without exudate. Neck is supple. Chest clear to auscultation without rales or wheezing. Left maxilla dentition palpated with tongue blade and no tenderness elicited.       Assessment & Plan:  Probable acute left  maxillary sinusitis  Intrauterine pregnancy-20 weeks  Bilateral serous otitis media  Plan: Zithromax Z-Pak take 2 tablets by mouth day one followed by 1 by mouth days 2 through 5. Tylenol if needed for pain.

## 2014-09-18 NOTE — Patient Instructions (Addendum)
Take Tylenol for pain. Take Zithromax Z-PAK as directed.

## 2014-11-07 ENCOUNTER — Encounter: Payer: 59 | Attending: Obstetrics and Gynecology

## 2014-11-07 VITALS — Ht 70.0 in | Wt 214.0 lb

## 2014-11-07 DIAGNOSIS — O24419 Gestational diabetes mellitus in pregnancy, unspecified control: Secondary | ICD-10-CM | POA: Diagnosis present

## 2014-11-07 DIAGNOSIS — Z713 Dietary counseling and surveillance: Secondary | ICD-10-CM | POA: Insufficient documentation

## 2014-11-07 NOTE — Progress Notes (Deleted)
Glucose reading during instruction /dl following high carbohydrate breakfast.

## 2014-11-07 NOTE — Progress Notes (Addendum)
  Patient was seen on 11/07/14 for Gestational Diabetes self-management . The following learning objectives were met by the patient :   States the definition of Gestational Diabetes  States why dietary management is important in controlling blood glucose  Describes the effects of carbohydrates on blood glucose levels  Demonstrates ability to create a balanced meal plan  Demonstrates carbohydrate counting   States when to check blood glucose levels  Demonstrates proper blood glucose monitoring techniques  States the effect of stress and exercise on blood glucose levels  States the importance of limiting caffeine and abstaining from alcohol and smoking  Plan:  Aim for 2 Carb Choices per meal (30 grams) +/- 1 either way for breakfast Aim for 3 Carb Choices per meal (45 grams) +/- 1 either way from lunch and dinner Aim for 1-2 Carbs per snack Begin reading food labels for Total Carbohydrate and sugar grams of foods Consider  increasing your activity level by walking daily as tolerated Begin checking BG before breakfast and 2 hours after first bit of breakfast, lunch and dinner after  as directed by MD  Take medication  as directed by MD  Blood glucose monitor given: One Touch Ultra Mini Lot # [id number] X Exp: 10/2015 Blood glucose reading: 200 mg/dl following high carbohydrate breakfast  Patient instructed to monitor glucose levels: FBS: 60 - <90 2 hour: <120  Patient received the following handouts:  Nutrition Diabetes and Pregnancy  Carbohydrate Counting List  Meal Planning worksheet  Patient will be seen for follow-up as needed.

## 2015-01-06 ENCOUNTER — Inpatient Hospital Stay (HOSPITAL_COMMUNITY): Payer: 59 | Admitting: Anesthesiology

## 2015-01-06 ENCOUNTER — Encounter (HOSPITAL_COMMUNITY): Payer: Self-pay | Admitting: *Deleted

## 2015-01-06 ENCOUNTER — Inpatient Hospital Stay (HOSPITAL_COMMUNITY)
Admission: AD | Admit: 2015-01-06 | Discharge: 2015-01-08 | DRG: 775 | Disposition: A | Discharge status: Home / Self Care | Payer: 59 | Source: Ambulatory Visit | Attending: Obstetrics and Gynecology | Admitting: Obstetrics and Gynecology

## 2015-01-06 DIAGNOSIS — IMO0001 Reserved for inherently not codable concepts without codable children: Secondary | ICD-10-CM

## 2015-01-06 DIAGNOSIS — Z833 Family history of diabetes mellitus: Secondary | ICD-10-CM | POA: Diagnosis not present

## 2015-01-06 DIAGNOSIS — Z8249 Family history of ischemic heart disease and other diseases of the circulatory system: Secondary | ICD-10-CM | POA: Diagnosis not present

## 2015-01-06 DIAGNOSIS — Z3A36 36 weeks gestation of pregnancy: Secondary | ICD-10-CM | POA: Diagnosis not present

## 2015-01-06 DIAGNOSIS — O24425 Gestational diabetes mellitus in childbirth, controlled by oral hypoglycemic drugs: Secondary | ICD-10-CM | POA: Diagnosis present

## 2015-01-06 DIAGNOSIS — O24424 Gestational diabetes mellitus in childbirth, insulin controlled: Secondary | ICD-10-CM | POA: Diagnosis present

## 2015-01-06 DIAGNOSIS — Z820 Family history of epilepsy and other diseases of the nervous system: Secondary | ICD-10-CM

## 2015-01-06 LAB — CBC
HEMATOCRIT: 38.8 % (ref 36.0–46.0)
Hemoglobin: 13.2 g/dL (ref 12.0–15.0)
MCH: 30.1 pg (ref 26.0–34.0)
MCHC: 34 g/dL (ref 30.0–36.0)
MCV: 88.4 fL (ref 78.0–100.0)
Platelets: 185 10*3/uL (ref 150–400)
RBC: 4.39 MIL/uL (ref 3.87–5.11)
RDW: 14.4 % (ref 11.5–15.5)
WBC: 10.2 10*3/uL (ref 4.0–10.5)

## 2015-01-06 LAB — GLUCOSE, CAPILLARY
GLUCOSE-CAPILLARY: 75 mg/dL (ref 65–99)
Glucose-Capillary: 88 mg/dL (ref 65–99)

## 2015-01-06 LAB — TYPE AND SCREEN
ABO/RH(D): A NEG
Antibody Screen: NEGATIVE

## 2015-01-06 MED ORDER — ONDANSETRON HCL 4 MG/2ML IJ SOLN: 4.0000 mg | Freq: Four times a day (QID) | INTRAMUSCULAR | Status: DC | PRN

## 2015-01-06 MED ORDER — FLEET ENEMA 7-19 GM/118ML RE ENEM: 1.0000 | ENEMA | RECTAL | Status: DC | PRN

## 2015-01-06 MED ORDER — LACTATED RINGERS IV SOLN
INTRAVENOUS | Status: DC
  Administered 2015-01-06: 19:00:00 via INTRAVENOUS

## 2015-01-06 MED ORDER — LIDOCAINE HCL (PF) 1 % IJ SOLN
INTRAMUSCULAR | Status: DC | PRN
  Administered 2015-01-06: 2 mL via EPIDURAL
  Administered 2015-01-06 (×2): 4 mL via EPIDURAL

## 2015-01-06 MED ORDER — OXYCODONE-ACETAMINOPHEN 5-325 MG PO TABS: 2.0000 | ORAL_TABLET | ORAL | Status: DC | PRN

## 2015-01-06 MED ORDER — OXYTOCIN BOLUS FROM INFUSION
500.0000 mL | INTRAVENOUS | Status: DC
  Administered 2015-01-06: 500 mL via INTRAVENOUS

## 2015-01-06 MED ORDER — FENTANYL 2.5 MCG/ML BUPIVACAINE 1/10 % EPIDURAL INFUSION (WH - ANES)
14.0000 mL/h | INTRAMUSCULAR | Status: DC | PRN
  Administered 2015-01-06 (×2): 14 mL/h via EPIDURAL

## 2015-01-06 MED ORDER — EPHEDRINE 5 MG/ML INJ
10.0000 mg | INTRAVENOUS | Status: DC | PRN
  Filled 2015-01-06: qty 2

## 2015-01-06 MED ORDER — SODIUM CHLORIDE 0.9 % IV SOLN
2.0000 g | Freq: Once | INTRAVENOUS | Status: AC
  Administered 2015-01-06: 2 g via INTRAVENOUS
  Filled 2015-01-06: qty 2000

## 2015-01-06 MED ORDER — DIPHENHYDRAMINE HCL 50 MG/ML IJ SOLN: 12.5000 mg | INTRAMUSCULAR | Status: DC | PRN

## 2015-01-06 MED ORDER — LIDOCAINE HCL (PF) 1 % IJ SOLN
30.0000 mL | INTRAMUSCULAR | Status: DC | PRN
  Filled 2015-01-06: qty 30

## 2015-01-06 MED ORDER — LACTATED RINGERS IV BOLUS (SEPSIS)
500.0000 mL | Freq: Once | INTRAVENOUS | Status: AC
  Administered 2015-01-06: 500 mL via INTRAVENOUS

## 2015-01-06 MED ORDER — OXYTOCIN 40 UNITS IN LACTATED RINGERS INFUSION - SIMPLE MED
62.5000 mL/h | INTRAVENOUS | Status: DC
  Filled 2015-01-06: qty 1000

## 2015-01-06 MED ORDER — OXYCODONE-ACETAMINOPHEN 5-325 MG PO TABS: 1.0000 | ORAL_TABLET | ORAL | Status: DC | PRN

## 2015-01-06 MED ORDER — LACTATED RINGERS IV SOLN
500.0000 mL | INTRAVENOUS | Status: DC | PRN
  Administered 2015-01-06: 500 mL via INTRAVENOUS

## 2015-01-06 MED ORDER — TERBUTALINE SULFATE 1 MG/ML IJ SOLN
0.2500 mg | Freq: Once | INTRAMUSCULAR | Status: AC
  Administered 2015-01-06: 0.25 mg via SUBCUTANEOUS
  Filled 2015-01-06: qty 1

## 2015-01-06 MED ORDER — CITRIC ACID-SODIUM CITRATE 334-500 MG/5ML PO SOLN: 30.0000 mL | ORAL | Status: DC | PRN

## 2015-01-06 MED ORDER — FENTANYL 2.5 MCG/ML BUPIVACAINE 1/10 % EPIDURAL INFUSION (WH - ANES)
INTRAMUSCULAR | Status: AC
  Administered 2015-01-06: 14 mL/h via EPIDURAL
  Filled 2015-01-06: qty 125

## 2015-01-06 MED ORDER — ACETAMINOPHEN 325 MG PO TABS: 650.0000 mg | ORAL_TABLET | ORAL | Status: DC | PRN

## 2015-01-06 MED ORDER — PHENYLEPHRINE 40 MCG/ML (10ML) SYRINGE FOR IV PUSH (FOR BLOOD PRESSURE SUPPORT)
80.0000 ug | PREFILLED_SYRINGE | INTRAVENOUS | Status: DC | PRN
  Filled 2015-01-06: qty 2

## 2015-01-06 MED ORDER — PHENYLEPHRINE 40 MCG/ML (10ML) SYRINGE FOR IV PUSH (FOR BLOOD PRESSURE SUPPORT)
PREFILLED_SYRINGE | INTRAVENOUS | Status: AC
  Filled 2015-01-06: qty 20

## 2015-01-06 NOTE — H&P (Signed)
Judy [name] is a 34 y.o. female presenting for Labor.  Presented to MAU with ctxs which have increased and cx change from 3-5cm.  Preg complicated by A2DM controlled with glyburide.  Normal NIPT and US.  GBS unknown (preterm). History OB History    Gravida Para Term Preterm AB TAB SAB Ectopic Multiple Living   5 2 2  2 1 1   2      Past Medical History  Diagnosis Date  . Epigastric pain   . Constipation   . Hyperlipidemia   . Gallstones     Recently diagnosed by Dr. Jarold MottoPatterson.  . Rash     Bruises easily, per medical history form dated 09/15/10  . Diabetes mellitus without complication (HCC)   . Gestational diabetes   . Hx of varicella   . ZOXWRUEA(540.9Headache(784.0)    Past Surgical History  Procedure Laterality Date  . Rhinoplasty    . Cholecystectomy     Family History: family history includes Diabetes in her maternal aunt and maternal grandfather; Fibromyalgia in her maternal aunt; Heart attack in her father; Heart disease in her father. Social History:  reports that she has never smoked. She has never used smokeless tobacco. She reports that she does not drink alcohol or use illicit drugs.   Prenatal Transfer Tool  Maternal Diabetes: Yes:  Diabetes Type:  Insulin/Medication controlled Genetic Screening: Normal Maternal Ultrasounds/Referrals: Normal Fetal Ultrasounds or other Referrals:  None Maternal Substance Abuse:  No Significant Maternal Medications:  Meds include: Other: Gyyburide Significant Maternal Lab Results:  None Other Comments:  None  ROS  Dilation: 5 Effacement (%): 80 Station: -3 Exam by:: Ginger Morris RN Blood pressure 129/83, pulse 90, temperature 98.2 F (36.8 C), temperature source Oral, resp. rate 16, height 5\' 10"  (1.778 m), weight 221 lb 8 oz (100.472 kg), last menstrual period 04/26/2014, unknown if currently breastfeeding. Exam Physical Exam  Prenatal labs: ABO, Rh:   Antibody:   Rubella:   RPR:    HBsAg:    HIV:    GBS:      Assessment/Plan: IUP at 36 3/7 Preterm labor now active labor.  Will admit and give Abx for unknown GBS.  Pt desires epidural and has history of rapid labor.  Anticpate SVD Will monitor glc in labor.   Daud Cayer C 01/06/2015, 6:26 PM

## 2015-01-06 NOTE — Progress Notes (Signed)
Dr Rana SnareLowe notified of VE, FHR pattern and contraction pattern and orders received

## 2015-01-06 NOTE — Anesthesia Procedure Notes (Signed)
Epidural Patient location during procedure: OB  Staffing Anesthesiologist: Knut Rondinelli Performed by: anesthesiologist   Preanesthetic Checklist Completed: patient identified, site marked, surgical consent, pre-op evaluation, timeout performed, IV checked, risks and benefits discussed and monitors and equipment checked  Epidural Patient position: sitting Prep: site prepped and draped and DuraPrep Patient monitoring: continuous pulse ox and blood pressure Approach: midline Location: L4-L5 Injection technique: LOR saline  Needle:  Needle type: Tuohy  Needle gauge: 17 G Needle length: 9 cm and 9 Needle insertion depth: 5.5 cm Catheter type: closed end flexible Catheter size: 19 Gauge Catheter at skin depth: 10 cm Test dose: negative  Assessment Events: blood not aspirated, injection not painful, no injection resistance, negative IV test and no paresthesia  Additional Notes Patient identified. Risks/Benefits/Options discussed with patient including but not limited to bleeding, infection, nerve damage, paralysis, failed block, incomplete pain control, headache, blood pressure changes, nausea, vomiting, reactions to medications, itching and postpartum back pain. Confirmed with bedside nurse the patient's most recent platelet count. Confirmed with patient that they are not currently taking any anticoagulation, have any bleeding history or any family history of bleeding disorders. Patient expressed understanding and wished to proceed. All questions were answered. Sterile technique was used throughout the entire procedure. Please see nursing notes for vital signs. Test dose was given through epidural catheter and negative prior to continuing to dose epidural or start infusion. Warning signs of high block given to the patient including shortness of breath, tingling/numbness in hands, complete motor block, or any concerning symptoms with instructions to call for help. Patient was given  instructions on fall risk and not to get out of bed. All questions and concerns addressed with instructions to call with any issues or inadequate analgesia.

## 2015-01-06 NOTE — Progress Notes (Signed)
Dr Rana SnareLowe notified of pt's arrival and complaints, orders received

## 2015-01-06 NOTE — Anesthesia Preprocedure Evaluation (Signed)
Anesthesia Evaluation  Patient identified by MRN, date of birth, ID band Patient awake    Reviewed: Allergy & Precautions, H&P , NPO status , Patient's Chart, lab work & pertinent test results  Airway Mallampati: II  TM Distance: >3 FB Neck ROM: full    Dental no notable dental hx.    Pulmonary neg pulmonary ROS,    Pulmonary exam normal breath sounds clear to auscultation       Cardiovascular negative cardio ROS Normal cardiovascular exam Rhythm:regular Rate:Normal     Neuro/Psych negative neurological ROS  negative psych ROS   GI/Hepatic negative GI ROS, Neg liver ROS,   Endo/Other  diabetes  Renal/GU negative Renal ROS  negative genitourinary   Musculoskeletal   Abdominal   Peds  Hematology negative hematology ROS (+)   Anesthesia Other Findings 3rd baby, previous 2 had epidurals, did well, denies blood thinning meds, GDM  Reproductive/Obstetrics (+) Pregnancy                             Anesthesia Physical Anesthesia Plan  ASA: II  Anesthesia Plan: Spinal   Post-op Pain Management:    Induction:   Airway Management Planned:   Additional Equipment:   Intra-op Plan:   Post-operative Plan:   Informed Consent: I have reviewed the patients History and Physical, chart, labs and discussed the procedure including the risks, benefits and alternatives for the proposed anesthesia with the patient or authorized representative who has indicated his/her understanding and acceptance.     Plan Discussed with:   Anesthesia Plan Comments:         Anesthesia Quick Evaluation

## 2015-01-06 NOTE — MAU Note (Signed)
Patient presents at [redacted] weeks gestation with c/o contractions since 1100 today. Decreased fetal movement noted today. Denies bleeding or discharge.

## 2015-01-07 ENCOUNTER — Encounter (HOSPITAL_COMMUNITY): Payer: Self-pay | Admitting: *Deleted

## 2015-01-07 LAB — CBC
HEMATOCRIT: 39.8 % (ref 36.0–46.0)
Hemoglobin: 13.6 g/dL (ref 12.0–15.0)
MCH: 30.1 pg (ref 26.0–34.0)
MCHC: 34.2 g/dL (ref 30.0–36.0)
MCV: 88.1 fL (ref 78.0–100.0)
PLATELETS: 164 10*3/uL (ref 150–400)
RBC: 4.52 MIL/uL (ref 3.87–5.11)
RDW: 14.6 % (ref 11.5–15.5)
WBC: 13.7 10*3/uL — AB (ref 4.0–10.5)

## 2015-01-07 LAB — RPR: RPR: NONREACTIVE

## 2015-01-07 MED ORDER — TETANUS-DIPHTH-ACELL PERTUSSIS 5-2.5-18.5 LF-MCG/0.5 IM SUSP
0.5000 mL | Freq: Once | INTRAMUSCULAR | Status: AC
  Administered 2015-01-07: 0.5 mL via INTRAMUSCULAR

## 2015-01-07 MED ORDER — BENZOCAINE-MENTHOL 20-0.5 % EX AERO
1.0000 | INHALATION_SPRAY | CUTANEOUS | Status: DC | PRN
Start: 2015-01-07 — End: 2015-01-08

## 2015-01-07 MED ORDER — ALBUTEROL SULFATE (2.5 MG/3ML) 0.083% IN NEBU: 3.0000 mL | INHALATION_SOLUTION | RESPIRATORY_TRACT | Status: DC | PRN

## 2015-01-07 MED ORDER — ZOLPIDEM TARTRATE 5 MG PO TABS: 5.0000 mg | ORAL_TABLET | Freq: Every evening | ORAL | Status: DC | PRN

## 2015-01-07 MED ORDER — LANOLIN HYDROUS EX OINT: TOPICAL_OINTMENT | CUTANEOUS | Status: DC | PRN

## 2015-01-07 MED ORDER — ONDANSETRON HCL 4 MG PO TABS: 4.0000 mg | ORAL_TABLET | ORAL | Status: DC | PRN

## 2015-01-07 MED ORDER — ONDANSETRON HCL 4 MG/2ML IJ SOLN: 4.0000 mg | INTRAMUSCULAR | Status: DC | PRN

## 2015-01-07 MED ORDER — SIMETHICONE 80 MG PO CHEW: 80.0000 mg | CHEWABLE_TABLET | ORAL | Status: DC | PRN

## 2015-01-07 MED ORDER — DIPHENHYDRAMINE HCL 25 MG PO CAPS: 25.0000 mg | ORAL_CAPSULE | Freq: Four times a day (QID) | ORAL | Status: DC | PRN

## 2015-01-07 MED ORDER — METHYLERGONOVINE MALEATE 0.2 MG PO TABS
0.2000 mg | ORAL_TABLET | Freq: Four times a day (QID) | ORAL | Status: AC
  Administered 2015-01-07 – 2015-01-08 (×6): 0.2 mg via ORAL
  Filled 2015-01-07 (×6): qty 1

## 2015-01-07 MED ORDER — METHYLERGONOVINE MALEATE 0.2 MG PO TABS
0.2000 mg | ORAL_TABLET | Freq: Four times a day (QID) | ORAL | Status: DC
  Administered 2015-01-07: 0.2 mg via ORAL
  Filled 2015-01-07: qty 1

## 2015-01-07 MED ORDER — DIBUCAINE 1 % RE OINT
1.0000 | TOPICAL_OINTMENT | RECTAL | Status: DC | PRN
Start: 2015-01-07 — End: 2015-01-08

## 2015-01-07 MED ORDER — RHO D IMMUNE GLOBULIN 1500 UNIT/2ML IJ SOSY
300.0000 ug | PREFILLED_SYRINGE | Freq: Once | INTRAMUSCULAR | Status: AC
  Administered 2015-01-07: 300 ug via INTRAVENOUS
  Filled 2015-01-07: qty 2

## 2015-01-07 MED ORDER — WITCH HAZEL-GLYCERIN EX PADS: 1.0000 "application " | MEDICATED_PAD | CUTANEOUS | Status: DC | PRN

## 2015-01-07 MED ORDER — SENNOSIDES-DOCUSATE SODIUM 8.6-50 MG PO TABS
2.0000 | ORAL_TABLET | ORAL | Status: DC
  Administered 2015-01-07 (×2): 2 via ORAL
  Filled 2015-01-07 (×2): qty 2

## 2015-01-07 MED ORDER — OXYCODONE-ACETAMINOPHEN 5-325 MG PO TABS
1.0000 | ORAL_TABLET | ORAL | Status: DC | PRN
Start: 2015-01-07 — End: 2015-01-08
  Administered 2015-01-07 (×3): 1 via ORAL
  Filled 2015-01-07 (×3): qty 1

## 2015-01-07 MED ORDER — PRENATAL MULTIVITAMIN CH
1.0000 | ORAL_TABLET | Freq: Every day | ORAL | Status: DC
  Administered 2015-01-07 – 2015-01-08 (×2): 1 via ORAL
  Filled 2015-01-07 (×2): qty 1

## 2015-01-07 MED ORDER — OXYCODONE-ACETAMINOPHEN 5-325 MG PO TABS: 2.0000 | ORAL_TABLET | ORAL | Status: DC | PRN

## 2015-01-07 MED ORDER — MEDROXYPROGESTERONE ACETATE 150 MG/ML IM SUSP: 150.0000 mg | INTRAMUSCULAR | Status: DC | PRN

## 2015-01-07 MED ORDER — ACETAMINOPHEN 325 MG PO TABS: 650.0000 mg | ORAL_TABLET | ORAL | Status: DC | PRN

## 2015-01-07 MED ORDER — IBUPROFEN 600 MG PO TABS
600.0000 mg | ORAL_TABLET | Freq: Four times a day (QID) | ORAL | Status: DC
  Administered 2015-01-07 – 2015-01-08 (×8): 600 mg via ORAL
  Filled 2015-01-07 (×8): qty 1

## 2015-01-07 MED ORDER — MEASLES, MUMPS & RUBELLA VAC ~~LOC~~ INJ
0.5000 mL | INJECTION | Freq: Once | SUBCUTANEOUS | Status: DC
  Filled 2015-01-07: qty 0.5

## 2015-01-07 NOTE — Anesthesia Postprocedure Evaluation (Signed)
  Anesthesia Post-op Note  Patient: Judy ReBrittain Hutzler  Procedure(s) Performed: * No procedures listed *  Patient Location: Mother/Baby  Anesthesia Type:Epidural  Level of Consciousness: awake, alert  and oriented  Airway and Oxygen Therapy: Patient Spontanous Breathing  Post-op Pain: none  Post-op Assessment: Post-op Vital signs reviewed, Patient's Cardiovascular Status Stable, Respiratory Function Stable, Patent Airway, No signs of Nausea or vomiting, Adequate PO intake, Pain level controlled and No headache              Post-op Vital Signs: Reviewed and stable  Last Vitals:  Filed Vitals:   01/07/15 0535  BP: 134/81  Pulse: 62  Temp: 36.2 C  Resp: 18    Complications: No apparent anesthesia complications

## 2015-01-07 NOTE — Lactation Note (Signed)
This note was copied from the chart of Boy [employer]. Lactation Consultation Note  Patient Name: Boy Brigid ReBrittain Eads ZOXWR'UToday's Date: 01/07/2015 Reason for consult: Follow-up assessment;NICU baby NICU baby 3916 hours old, 6039w4d CGA. Mom states that she had low milk supply with both of her 2 older children. Mom reports that she has seen a little colostrum from right breast, and she has not been pumping regularly. Enc mom to pump 8 times/24 hours for 15 minutes. Enc hand expressing after pumping. Discussed normal progression of milk coming to volume. Mom aware of pumping rooms in NICU and has her own DEBP. Mom aware of OP/BFSG and LC phone line assistance after D/C. Enc mom to call for assistance as needed.   Maternal Data Has patient been taught Hand Expression?: Yes Does the patient have breastfeeding experience prior to this delivery?: Yes  Feeding    LATCH Score/Interventions                      Lactation Tools Discussed/Used Pump Review: Setup, frequency, and cleaning;Milk Storage Initiated by:: Bedside RN Date initiated:: 01/06/15   Consult Status Consult Status: Follow-up Date: 01/08/15 Follow-up type: In-patient    Geralynn OchsWILLIARD, Shanequia Kendrick 01/07/2015, 2:40 PM

## 2015-01-07 NOTE — Progress Notes (Signed)
Post Partum Day 1 Subjective: No complaints, Denies HA, blurred vision or RUQ pain  Objective: Blood pressure 134/81, pulse 62, temperature 97.2 F (36.2 C), temperature source Oral, resp. rate 18, height 5\' 10"  (1.778 m), weight 221 lb 8 oz (100.472 kg), last menstrual period 04/26/2014, SpO2 99 %, unknown if currently breastfeeding.  Physical Exam:  General: no complaints, up ad lib, voiding, tolerating PO, + flatus and denies HA,  Lochia: appropriate Uterine Fundus: firm Incision: perineum intact DVT Evaluation: No evidence of DVT seen on physical exam. Negative Homan's sign. No cords or calf tenderness. No significant calf/ankle edema. DTR's 2+, no clonus   Recent Labs  01/06/15 1655 01/07/15 0533  HGB 13.2 13.6  HCT 38.8 39.8    Assessment/Plan: Plan for discharge tomorrow and Circumcision prior to discharge   plan cbc and cmp in am   LOS: 1 day   Shabria Egley G 01/07/2015, 8:33 AM

## 2015-01-08 LAB — COMPREHENSIVE METABOLIC PANEL
ALBUMIN: 2.4 g/dL — AB (ref 3.5–5.0)
ALK PHOS: 108 U/L (ref 38–126)
ALT: 10 U/L — AB (ref 14–54)
ANION GAP: 5 (ref 5–15)
AST: 16 U/L (ref 15–41)
BUN: 11 mg/dL (ref 6–20)
CALCIUM: 8.6 mg/dL — AB (ref 8.9–10.3)
CO2: 24 mmol/L (ref 22–32)
Chloride: 110 mmol/L (ref 101–111)
Creatinine, Ser: 0.66 mg/dL (ref 0.44–1.00)
GFR calc Af Amer: >60 mL/min (ref >60)
GFR calc non Af Amer: >60 mL/min (ref >60)
GLUCOSE: 109 mg/dL — AB (ref 65–99)
Potassium: 3.5 mmol/L (ref 3.5–5.1)
SODIUM: 139 mmol/L (ref 135–145)
Total Bilirubin: 0.4 mg/dL (ref 0.3–1.2)
Total Protein: 5.7 g/dL — ABNORMAL LOW (ref 6.5–8.1)

## 2015-01-08 LAB — CBC
HCT: 38.8 % (ref 36.0–46.0)
HEMOGLOBIN: 13.1 g/dL (ref 12.0–15.0)
MCH: 30.3 pg (ref 26.0–34.0)
MCHC: 33.8 g/dL (ref 30.0–36.0)
MCV: 89.6 fL (ref 78.0–100.0)
Platelets: 166 10*3/uL (ref 150–400)
RBC: 4.33 MIL/uL (ref 3.87–5.11)
RDW: 14.7 % (ref 11.5–15.5)
WBC: 10.6 10*3/uL — AB (ref 4.0–10.5)

## 2015-01-08 LAB — RH IG WORKUP (INCLUDES ABO/RH)
ABO/RH(D): A NEG
Fetal Screen: NEGATIVE
Gestational Age(Wks): 36
Unit division: 0

## 2015-01-08 LAB — GLUCOSE, CAPILLARY: Glucose-Capillary: 109 mg/dL — ABNORMAL HIGH (ref 65–99)

## 2015-01-08 MED ORDER — OXYCODONE-ACETAMINOPHEN 5-325 MG PO TABS: 1.0000 | ORAL_TABLET | ORAL | Status: DC | PRN

## 2015-01-08 MED ORDER — IBUPROFEN 600 MG PO TABS: 600.0000 mg | ORAL_TABLET | Freq: Four times a day (QID) | ORAL | Status: DC

## 2015-01-08 NOTE — Progress Notes (Signed)
CSW acknowledges NICU admission.    Patient screened out for psychosocial assessment since none of the following apply:  Psychosocial stressors documented in mother or baby's chart  Gestation less than 32 weeks  Code at delivery   Critically ill infant  Infant with anomalies  Please contact the Clinical Social Worker if specific needs arise, or by MOB's request.       

## 2015-01-08 NOTE — Lactation Note (Signed)
This note was copied from the chart of Judy [name]. Lactation Consultation Note  Patient Name: Judy Brigid ReBrittain Nicklin ZOXWR'UToday's Date: 01/08/2015 Reason for consult: Follow-up assessment;NICU baby   To bedside, mom did not BF this feeding. She is holding infant at present and is tearful. Enc mom to call once she decides on renting vs buying a pump.     Maternal Data Formula Feeding for Exclusion: No  Feeding Feeding Type: Formula Length of feed: 30 min  LATCH Score/Interventions                      Lactation Tools Discussed/Used     Consult Status Consult Status: PRN Follow-up type: Call as needed    Ed BlalockSharon S Pauline Pegues 01/08/2015, 12:45 PM

## 2015-01-08 NOTE — Discharge Summary (Signed)
Obstetric Discharge Summary Reason for Admission: onset of labor Prenatal Procedures: ultrasound Intrapartum Procedures: spontaneous vaginal delivery Postpartum Procedures: none Complications-Operative and Postpartum: none HEMOGLOBIN  Date Value Ref Range Status  01/08/2015 13.1 12.0 - 15.0 g/dL Final   HCT  Date Value Ref Range Status  01/08/2015 38.8 36.0 - 46.0 % Final    Physical Exam:  General: alert and cooperative Lochia: appropriate Uterine Fundus: firm Incision: perineum intact DVT Evaluation: No evidence of DVT seen on physical exam. Negative Homan's sign. No cords or calf tenderness. No significant calf/ankle edema.  Discharge Diagnoses: s/p vag delivery @36  3/7 weeks  Discharge Information: Date: 01/08/2015 Activity: pelvic rest Diet: routine Medications: PNV, Ibuprofen and Percocet Condition: stable Instructions: refer to practice specific booklet Discharge to: home   Newborn Data: Live born female  Birth Weight: 7 lb 14.3 oz (3580 g) APGAR: 8, 8  Baby in NICU  Judy [name] G 01/08/2015, 7:57 AM

## 2015-01-08 NOTE — Lactation Note (Signed)
This note was copied from the chart of Judy [name]. Lactation Consultation Note  Patient Name: Judy [name]'s Date: 01/08/2015 Reason for consult: Follow-up assessment;NICU baby   Mom is to be D/C today. Mom in NICU holding infant STS. She has a Medela PIS and another DEBP at home. She was informed of Hospital pump rentals. She is planning to use the ones she has a home now and them pumps in NICU. She is currently not getting any volume. She had issues with supply with first 2 children. Enc. Her to pump every 2-3 hours and to pump after holding infant in NICU. Mom voiced understanding. She has bottles and labels to take home. Mom to call PRN.    Maternal Data Formula Feeding for Exclusion: No  Feeding Feeding Type: Formula Length of feed: 30 min  LATCH Score/Interventions                      Lactation Tools Discussed/Used     Consult Status Consult Status: PRN Follow-up type: Call as needed    Ed BlalockSharon S Jaidyn Kuhl 01/08/2015, 11:07 AM

## 2015-03-24 NOTE — L&D Delivery Note (Signed)
Delivery Note At 9:09 PM a healthy female was delivered via SVD (Presentation: OP).  APGAR: 9, 9; weight pending.  Placenta status: S/I/3VC; abnormal in appearance with 1/5 of placenta densely fibrotic.  with the following complications: none.  Cord pH: n/a  Anesthesia:  epidural Episiotomy:  none Lacerations:  none Suture Repair: n/a Est. Blood Loss (mL):  250  Mom to postpartum.  Baby to Couplet care / Skin to Skin.  Judy [name] 01/09/2016, 9:25 PM

## 2015-06-11 DIAGNOSIS — Z029 Encounter for administrative examinations, unspecified: Secondary | ICD-10-CM

## 2015-07-10 ENCOUNTER — Other Ambulatory Visit (HOSPITAL_COMMUNITY): Payer: Self-pay | Admitting: Obstetrics and Gynecology

## 2015-07-10 DIAGNOSIS — O283 Abnormal ultrasonic finding on antenatal screening of mother: Secondary | ICD-10-CM

## 2015-07-10 DIAGNOSIS — Z3682 Encounter for antenatal screening for nuchal translucency: Secondary | ICD-10-CM

## 2015-07-10 DIAGNOSIS — Z3A13 13 weeks gestation of pregnancy: Secondary | ICD-10-CM

## 2015-07-15 LAB — OB RESULTS CONSOLE HEPATITIS B SURFACE ANTIGEN: HEP B S AG: NEGATIVE

## 2015-07-15 LAB — OB RESULTS CONSOLE GC/CHLAMYDIA
Chlamydia: NEGATIVE
Gonorrhea: NEGATIVE

## 2015-07-15 LAB — OB RESULTS CONSOLE RUBELLA ANTIBODY, IGM: RUBELLA: IMMUNE

## 2015-07-15 LAB — OB RESULTS CONSOLE RPR: RPR: NONREACTIVE

## 2015-07-15 LAB — OB RESULTS CONSOLE ANTIBODY SCREEN: ANTIBODY SCREEN: NEGATIVE

## 2015-07-15 LAB — OB RESULTS CONSOLE ABO/RH: RH TYPE: NEGATIVE

## 2015-07-15 LAB — OB RESULTS CONSOLE HIV ANTIBODY (ROUTINE TESTING): HIV: NONREACTIVE

## 2015-07-16 ENCOUNTER — Encounter (HOSPITAL_COMMUNITY): Payer: Self-pay

## 2015-07-16 ENCOUNTER — Ambulatory Visit (HOSPITAL_COMMUNITY)
Admission: RE | Admit: 2015-07-16 | Discharge: 2015-07-16 | Disposition: A | Discharge status: Home / Self Care | Payer: BLUE CROSS/BLUE SHIELD | Source: Ambulatory Visit | Attending: Internal Medicine | Admitting: Internal Medicine

## 2015-07-16 ENCOUNTER — Ambulatory Visit (HOSPITAL_COMMUNITY): Admission: RE | Admit: 2015-07-16 | Payer: BLUE CROSS/BLUE SHIELD | Source: Ambulatory Visit

## 2015-07-16 ENCOUNTER — Other Ambulatory Visit (HOSPITAL_COMMUNITY): Payer: Self-pay | Admitting: Obstetrics and Gynecology

## 2015-07-16 DIAGNOSIS — Z3A12 12 weeks gestation of pregnancy: Secondary | ICD-10-CM

## 2015-07-16 DIAGNOSIS — O43101 Malformation of placenta, unspecified, first trimester: Secondary | ICD-10-CM

## 2015-07-16 DIAGNOSIS — O43191 Other malformation of placenta, first trimester: Secondary | ICD-10-CM | POA: Insufficient documentation

## 2015-07-16 DIAGNOSIS — O283 Abnormal ultrasonic finding on antenatal screening of mother: Secondary | ICD-10-CM

## 2015-07-16 DIAGNOSIS — Z3682 Encounter for antenatal screening for nuchal translucency: Secondary | ICD-10-CM

## 2015-07-16 DIAGNOSIS — Z36 Encounter for antenatal screening of mother: Secondary | ICD-10-CM | POA: Insufficient documentation

## 2015-07-16 DIAGNOSIS — Z3A13 13 weeks gestation of pregnancy: Secondary | ICD-10-CM

## 2015-07-17 ENCOUNTER — Other Ambulatory Visit (HOSPITAL_COMMUNITY): Payer: Self-pay | Admitting: *Deleted

## 2015-07-17 DIAGNOSIS — O43109 Malformation of placenta, unspecified, unspecified trimester: Secondary | ICD-10-CM

## 2015-07-23 ENCOUNTER — Telehealth (HOSPITAL_COMMUNITY): Payer: Self-pay | Admitting: MS"

## 2015-07-23 ENCOUNTER — Encounter (HOSPITAL_COMMUNITY): Payer: Self-pay

## 2015-07-23 NOTE — Telephone Encounter (Signed)
Called Judy [name] to discuss her prenatal cell free DNA test results.  Mrs. Judy [name] had Panorama testing through [facility].  Testing was offered because of ultrasound findings.   The patient was identified by name and DOB.  We reviewed that these are within normal limits, showing a less than 1 in 10,000 risk for trisomies 21, 18 and 13, and monosomy X (Turner syndrome).  In addition, the risk for triploidy and sex chromosome trisomies (47,XXX and 47,XXY) was also low risk. We reviewed that this testing identifies > 99% of pregnancies with trisomy 3421, trisomy 3813, sex chromosome trisomies (47,XXX and 47,XXY), and triploidy. The detection rate for trisomy 18 is 96%.  The detection rate for monosomy X is ~92%.  The false positive rate is <0.1% for all conditions. Testing was also consistent with female fetal sex.  The patient did wish to know fetal sex.  She understands that this testing does not identify all genetic conditions.  All questions were answered to her satisfaction, she was encouraged to call with additional questions or concerns.  Quinn PlowmanKaren Ezana Hubbert, MS Certified Genetic Counselor 07/23/2015 3:38 PM

## 2015-07-25 ENCOUNTER — Other Ambulatory Visit (HOSPITAL_COMMUNITY): Payer: Self-pay

## 2015-08-13 ENCOUNTER — Ambulatory Visit (HOSPITAL_COMMUNITY)
Admission: RE | Admit: 2015-08-13 | Discharge: 2015-08-13 | Disposition: A | Discharge status: Home / Self Care | Payer: BLUE CROSS/BLUE SHIELD | Source: Ambulatory Visit | Attending: Obstetrics and Gynecology | Admitting: Obstetrics and Gynecology

## 2015-08-13 ENCOUNTER — Encounter (HOSPITAL_COMMUNITY): Payer: Self-pay

## 2015-08-13 ENCOUNTER — Other Ambulatory Visit (HOSPITAL_COMMUNITY): Payer: Self-pay | Admitting: Maternal and Fetal Medicine

## 2015-08-13 DIAGNOSIS — Z36 Encounter for antenatal screening of mother: Secondary | ICD-10-CM | POA: Diagnosis not present

## 2015-08-13 DIAGNOSIS — Z3A16 16 weeks gestation of pregnancy: Secondary | ICD-10-CM | POA: Insufficient documentation

## 2015-08-13 DIAGNOSIS — O43109 Malformation of placenta, unspecified, unspecified trimester: Secondary | ICD-10-CM

## 2015-08-13 DIAGNOSIS — Z3689 Encounter for other specified antenatal screening: Secondary | ICD-10-CM

## 2015-08-13 DIAGNOSIS — O43192 Other malformation of placenta, second trimester: Secondary | ICD-10-CM | POA: Diagnosis not present

## 2015-08-27 ENCOUNTER — Ambulatory Visit (HOSPITAL_COMMUNITY)
Admission: RE | Admit: 2015-08-27 | Discharge: 2015-08-27 | Disposition: A | Discharge status: Home / Self Care | Payer: BLUE CROSS/BLUE SHIELD | Source: Ambulatory Visit | Attending: Obstetrics and Gynecology | Admitting: Obstetrics and Gynecology

## 2015-08-27 ENCOUNTER — Encounter (HOSPITAL_COMMUNITY): Payer: Self-pay

## 2015-08-27 ENCOUNTER — Other Ambulatory Visit (HOSPITAL_COMMUNITY): Payer: Self-pay | Admitting: Maternal and Fetal Medicine

## 2015-08-27 DIAGNOSIS — Z0489 Encounter for examination and observation for other specified reasons: Secondary | ICD-10-CM

## 2015-08-27 DIAGNOSIS — O43192 Other malformation of placenta, second trimester: Secondary | ICD-10-CM | POA: Insufficient documentation

## 2015-08-27 DIAGNOSIS — Z36 Encounter for antenatal screening of mother: Secondary | ICD-10-CM | POA: Insufficient documentation

## 2015-08-27 DIAGNOSIS — O43102 Malformation of placenta, unspecified, second trimester: Secondary | ICD-10-CM

## 2015-08-27 DIAGNOSIS — Z3A18 18 weeks gestation of pregnancy: Secondary | ICD-10-CM

## 2015-08-27 DIAGNOSIS — O09292 Supervision of pregnancy with other poor reproductive or obstetric history, second trimester: Secondary | ICD-10-CM | POA: Insufficient documentation

## 2015-08-27 DIAGNOSIS — IMO0002 Reserved for concepts with insufficient information to code with codable children: Secondary | ICD-10-CM

## 2015-08-27 DIAGNOSIS — O09212 Supervision of pregnancy with history of pre-term labor, second trimester: Secondary | ICD-10-CM | POA: Insufficient documentation

## 2015-08-27 DIAGNOSIS — O43109 Malformation of placenta, unspecified, unspecified trimester: Secondary | ICD-10-CM

## 2015-10-09 ENCOUNTER — Ambulatory Visit (HOSPITAL_COMMUNITY): Payer: BLUE CROSS/BLUE SHIELD

## 2016-01-07 ENCOUNTER — Encounter (HOSPITAL_COMMUNITY): Payer: Self-pay | Admitting: *Deleted

## 2016-01-07 ENCOUNTER — Telehealth (HOSPITAL_COMMUNITY): Payer: Self-pay | Admitting: *Deleted

## 2016-01-07 LAB — OB RESULTS CONSOLE GBS: GBS: NEGATIVE

## 2016-01-07 NOTE — Telephone Encounter (Signed)
Preadmission screen  

## 2016-01-09 ENCOUNTER — Encounter (HOSPITAL_COMMUNITY): Payer: Self-pay | Admitting: *Deleted

## 2016-01-09 ENCOUNTER — Inpatient Hospital Stay (HOSPITAL_COMMUNITY): Payer: BLUE CROSS/BLUE SHIELD | Admitting: Anesthesiology

## 2016-01-09 ENCOUNTER — Inpatient Hospital Stay (HOSPITAL_COMMUNITY)
Admission: AD | Admit: 2016-01-09 | Discharge: 2016-01-11 | DRG: 775 | Disposition: A | Discharge status: Home / Self Care | Payer: BLUE CROSS/BLUE SHIELD | Source: Ambulatory Visit | Attending: Obstetrics & Gynecology | Admitting: Obstetrics & Gynecology

## 2016-01-09 DIAGNOSIS — O26893 Other specified pregnancy related conditions, third trimester: Secondary | ICD-10-CM | POA: Diagnosis present

## 2016-01-09 DIAGNOSIS — Z8249 Family history of ischemic heart disease and other diseases of the circulatory system: Secondary | ICD-10-CM | POA: Diagnosis not present

## 2016-01-09 DIAGNOSIS — O24425 Gestational diabetes mellitus in childbirth, controlled by oral hypoglycemic drugs: Principal | ICD-10-CM | POA: Diagnosis present

## 2016-01-09 DIAGNOSIS — Z833 Family history of diabetes mellitus: Secondary | ICD-10-CM

## 2016-01-09 DIAGNOSIS — Z6791 Unspecified blood type, Rh negative: Secondary | ICD-10-CM | POA: Diagnosis not present

## 2016-01-09 DIAGNOSIS — Z3A38 38 weeks gestation of pregnancy: Secondary | ICD-10-CM

## 2016-01-09 DIAGNOSIS — Z3490 Encounter for supervision of normal pregnancy, unspecified, unspecified trimester: Secondary | ICD-10-CM

## 2016-01-09 DIAGNOSIS — Z3483 Encounter for supervision of other normal pregnancy, third trimester: Secondary | ICD-10-CM | POA: Diagnosis present

## 2016-01-09 LAB — CBC
HCT: 38.9 % (ref 36.0–46.0)
Hemoglobin: 13.8 g/dL (ref 12.0–15.0)
MCH: 30.7 pg (ref 26.0–34.0)
MCHC: 35.5 g/dL (ref 30.0–36.0)
MCV: 86.4 fL (ref 78.0–100.0)
PLATELETS: 196 10*3/uL (ref 150–400)
RBC: 4.5 MIL/uL (ref 3.87–5.11)
RDW: 13.8 % (ref 11.5–15.5)
WBC: 10.1 10*3/uL (ref 4.0–10.5)

## 2016-01-09 LAB — TYPE AND SCREEN
ABO/RH(D): A NEG
ANTIBODY SCREEN: NEGATIVE

## 2016-01-09 LAB — GLUCOSE, RANDOM: Glucose, Bld: 88 mg/dL (ref 65–99)

## 2016-01-09 MED ORDER — LACTATED RINGERS IV SOLN
INTRAVENOUS | Status: DC
  Administered 2016-01-09: 21:00:00 via INTRAVENOUS

## 2016-01-09 MED ORDER — LIDOCAINE HCL (PF) 1 % IJ SOLN
INTRAMUSCULAR | Status: DC | PRN
  Administered 2016-01-09: 5 mL

## 2016-01-09 MED ORDER — TETANUS-DIPHTH-ACELL PERTUSSIS 5-2.5-18.5 LF-MCG/0.5 IM SUSP: 0.5000 mL | Freq: Once | INTRAMUSCULAR | Status: DC

## 2016-01-09 MED ORDER — LACTATED RINGERS IV SOLN
500.0000 mL | Freq: Once | INTRAVENOUS | Status: AC
  Administered 2016-01-09: 1000 mL via INTRAVENOUS

## 2016-01-09 MED ORDER — ACETAMINOPHEN 325 MG PO TABS: 650.0000 mg | ORAL_TABLET | ORAL | Status: DC | PRN

## 2016-01-09 MED ORDER — DIPHENHYDRAMINE HCL 25 MG PO CAPS: 25.0000 mg | ORAL_CAPSULE | Freq: Four times a day (QID) | ORAL | Status: DC | PRN

## 2016-01-09 MED ORDER — FENTANYL CITRATE (PF) 100 MCG/2ML IJ SOLN
50.0000 ug | INTRAMUSCULAR | Status: DC | PRN
  Administered 2016-01-09: 100 ug via INTRAVENOUS
  Filled 2016-01-09: qty 2

## 2016-01-09 MED ORDER — OXYCODONE-ACETAMINOPHEN 5-325 MG PO TABS: 1.0000 | ORAL_TABLET | ORAL | Status: DC | PRN

## 2016-01-09 MED ORDER — FLEET ENEMA 7-19 GM/118ML RE ENEM: 1.0000 | ENEMA | RECTAL | Status: DC | PRN

## 2016-01-09 MED ORDER — OXYCODONE-ACETAMINOPHEN 5-325 MG PO TABS: 2.0000 | ORAL_TABLET | ORAL | Status: DC | PRN

## 2016-01-09 MED ORDER — EPHEDRINE 5 MG/ML INJ: 10.0000 mg | INTRAVENOUS | Status: DC | PRN

## 2016-01-09 MED ORDER — LACTATED RINGERS IV SOLN: 500.0000 mL | INTRAVENOUS | Status: DC | PRN

## 2016-01-09 MED ORDER — PHENYLEPHRINE 40 MCG/ML (10ML) SYRINGE FOR IV PUSH (FOR BLOOD PRESSURE SUPPORT)
80.0000 ug | PREFILLED_SYRINGE | INTRAVENOUS | Status: DC | PRN
  Filled 2016-01-09: qty 10

## 2016-01-09 MED ORDER — PHENYLEPHRINE 40 MCG/ML (10ML) SYRINGE FOR IV PUSH (FOR BLOOD PRESSURE SUPPORT): 80.0000 ug | PREFILLED_SYRINGE | INTRAVENOUS | Status: DC | PRN

## 2016-01-09 MED ORDER — IBUPROFEN 600 MG PO TABS
600.0000 mg | ORAL_TABLET | Freq: Four times a day (QID) | ORAL | Status: DC
  Administered 2016-01-10 – 2016-01-11 (×7): 600 mg via ORAL
  Filled 2016-01-09 (×7): qty 1

## 2016-01-09 MED ORDER — SOD CITRATE-CITRIC ACID 500-334 MG/5ML PO SOLN: 30.0000 mL | ORAL | Status: DC | PRN

## 2016-01-09 MED ORDER — ONDANSETRON HCL 4 MG/2ML IJ SOLN: 4.0000 mg | Freq: Four times a day (QID) | INTRAMUSCULAR | Status: DC | PRN

## 2016-01-09 MED ORDER — PRENATAL MULTIVITAMIN CH
1.0000 | ORAL_TABLET | Freq: Every day | ORAL | Status: DC
  Administered 2016-01-10: 1 via ORAL
  Filled 2016-01-09: qty 1

## 2016-01-09 MED ORDER — BENZOCAINE-MENTHOL 20-0.5 % EX AERO
1.0000 "application " | INHALATION_SPRAY | CUTANEOUS | Status: DC | PRN
  Administered 2016-01-10: 1 via TOPICAL
  Filled 2016-01-09: qty 56

## 2016-01-09 MED ORDER — SIMETHICONE 80 MG PO CHEW: 80.0000 mg | CHEWABLE_TABLET | ORAL | Status: DC | PRN

## 2016-01-09 MED ORDER — LACTATED RINGERS IV SOLN: 500.0000 mL | Freq: Once | INTRAVENOUS | Status: DC

## 2016-01-09 MED ORDER — ONDANSETRON HCL 4 MG PO TABS: 4.0000 mg | ORAL_TABLET | ORAL | Status: DC | PRN

## 2016-01-09 MED ORDER — SENNOSIDES-DOCUSATE SODIUM 8.6-50 MG PO TABS
2.0000 | ORAL_TABLET | ORAL | Status: DC
  Administered 2016-01-10 (×2): 2 via ORAL
  Filled 2016-01-09 (×2): qty 2

## 2016-01-09 MED ORDER — OXYTOCIN BOLUS FROM INFUSION
500.0000 mL | Freq: Once | INTRAVENOUS | Status: AC
  Administered 2016-01-09: 500 mL via INTRAVENOUS

## 2016-01-09 MED ORDER — COCONUT OIL OIL: 1.0000 "application " | TOPICAL_OIL | Status: DC | PRN

## 2016-01-09 MED ORDER — LIDOCAINE HCL (PF) 1 % IJ SOLN
30.0000 mL | INTRAMUSCULAR | Status: DC | PRN
  Filled 2016-01-09: qty 30

## 2016-01-09 MED ORDER — ZOLPIDEM TARTRATE 5 MG PO TABS: 5.0000 mg | ORAL_TABLET | Freq: Every evening | ORAL | Status: DC | PRN

## 2016-01-09 MED ORDER — ONDANSETRON HCL 4 MG/2ML IJ SOLN: 4.0000 mg | INTRAMUSCULAR | Status: DC | PRN

## 2016-01-09 MED ORDER — DIBUCAINE 1 % RE OINT: 1.0000 "application " | TOPICAL_OINTMENT | RECTAL | Status: DC | PRN

## 2016-01-09 MED ORDER — OXYTOCIN 40 UNITS IN LACTATED RINGERS INFUSION - SIMPLE MED
2.5000 [IU]/h | INTRAVENOUS | Status: DC
Start: 2016-01-09 — End: 2016-01-09
  Administered 2016-01-09: 2.5 [IU]/h via INTRAVENOUS
  Filled 2016-01-09: qty 1000

## 2016-01-09 MED ORDER — FENTANYL 2.5 MCG/ML BUPIVACAINE 1/10 % EPIDURAL INFUSION (WH - ANES)
14.0000 mL/h | INTRAMUSCULAR | Status: DC | PRN
  Administered 2016-01-09 (×2): 14 mL/h via EPIDURAL
  Filled 2016-01-09: qty 125

## 2016-01-09 MED ORDER — DIPHENHYDRAMINE HCL 50 MG/ML IJ SOLN: 12.5000 mg | INTRAMUSCULAR | Status: DC | PRN

## 2016-01-09 MED ORDER — WITCH HAZEL-GLYCERIN EX PADS: 1.0000 "application " | MEDICATED_PAD | CUTANEOUS | Status: DC | PRN

## 2016-01-09 NOTE — MAU Note (Signed)
Pt reports contractions, denies bleeding or ROM. UC's x one hour.

## 2016-01-09 NOTE — Anesthesia Procedure Notes (Addendum)
Epidural Patient location during procedure: OB Start time: 01/09/2016 8:50 PM End time: 01/09/2016 8:57 PM  Staffing Anesthesiologist: Judy [name], Judy [name] Performed: anesthesiologist   Preanesthetic Checklist Completed: patient identified, site marked, surgical consent, pre-op evaluation, timeout performed, IV checked, risks and benefits discussed and monitors and equipment checked  Epidural Patient position: sitting Prep: ChloraPrep Patient monitoring: heart rate, continuous pulse ox and blood pressure Approach: midline Location: L3-L4 Injection technique: LOR saline  Needle:  Needle type: Tuohy  Needle gauge: 17 G Needle length: 9 cm Catheter type: closed end flexible Catheter size: 20 Guage Test dose: negative and 1.5% lidocaine  Assessment Events: blood not aspirated, injection not painful, no injection resistance and no paresthesia  Additional Notes LOR @ 5  Patient identified. Risks/Benefits/Options discussed with patient including but not limited to bleeding, infection, nerve damage, paralysis, failed block, incomplete pain control, headache, blood pressure changes, nausea, vomiting, reactions to medications, itching and postpartum back pain. Confirmed with bedside nurse the patient's most recent platelet count. Confirmed with patient that they are not currently taking any anticoagulation, have any bleeding history or any family history of bleeding disorders. Patient expressed understanding and wished to proceed. All questions were answered. Sterile technique was used throughout the entire procedure. Please see nursing notes for vital signs. Test dose was given through epidural catheter and negative prior to continuing to dose epidural or start infusion. Warning signs of high block given to the patient including shortness of breath, tingling/numbness in hands, complete motor block, or any concerning symptoms with instructions to call for help. Patient was given instructions on  fall risk and not to get out of bed. All questions and concerns addressed with instructions to call with any issues or inadequate analgesia.    Reason for block:procedure for pain

## 2016-01-09 NOTE — Anesthesia Preprocedure Evaluation (Signed)
Anesthesia Evaluation   Patient awake    Reviewed: Allergy & Precautions, Patient's Chart, lab work & pertinent test results  Airway Mallampati: II  TM Distance: >3 FB Neck ROM: Full    Dental  (+) Teeth Intact, Dental Advisory Given   Pulmonary neg pulmonary ROS,    breath sounds clear to auscultation       Cardiovascular negative cardio ROS   Rhythm:Regular Rate:Normal     Neuro/Psych  Headaches, negative psych ROS   GI/Hepatic negative GI ROS, Neg liver ROS,   Endo/Other  diabetes, Gestational  Renal/GU negative Renal ROS  negative genitourinary   Musculoskeletal negative musculoskeletal ROS (+)   Abdominal   Peds negative pediatric ROS (+)  Hematology negative hematology ROS (+)   Anesthesia Other Findings   Reproductive/Obstetrics negative OB ROS                             Lab Results  Component Value Date   WBC 10.1 01/09/2016   HGB 13.8 01/09/2016   HCT 38.9 01/09/2016   MCV 86.4 01/09/2016   PLT 196 01/09/2016   No results found for: INR, PROTIME   Anesthesia Physical Anesthesia Plan  ASA: II  Anesthesia Plan: Epidural   Post-op Pain Management:    Induction:   Airway Management Planned:   Additional Equipment:   Intra-op Plan:   Post-operative Plan:   Informed Consent: I have reviewed the patients History and Physical, chart, labs and discussed the procedure including the risks, benefits and alternatives for the proposed anesthesia with the patient or authorized representative who has indicated his/her understanding and acceptance.     Plan Discussed with:   Anesthesia Plan Comments:         Anesthesia Quick Evaluation

## 2016-01-09 NOTE — H&P (Signed)
Judy [name] is a 35 y.o. female presenting for labor.  CTX started two hours ago.  No LOF or VB.  Active FM.  Antepartum course complicated by A2DM; Glyburide 5 q am, 7.5 qhs.  Additionally, the patient has been followed by MFM for hydropic placenta.  Panorama wnl and serial growth u/s wnl.  Rh negative.  GBS negative.  OB History    Gravida Para Term Preterm AB Living   6 3 2 1 2 3    SAB TAB Ectopic Multiple Live Births   1 1   0 3     Past Medical History:  Diagnosis Date  . Constipation   . Diabetes mellitus without complication (HCC)   . Epigastric pain   . Gallstones    Recently diagnosed by Dr. Jarold MottoPatterson.  . Gestational diabetes   . Headache(784.0)   . Hx of varicella   . Hyperlipidemia   . Rash    Bruises easily, per medical history form dated 09/15/10   Past Surgical History:  Procedure Laterality Date  . CHOLECYSTECTOMY    . RHINOPLASTY     Family History: family history includes Diabetes in her maternal aunt and maternal grandfather; Fibromyalgia in her maternal aunt; Heart attack in her father; Heart disease in her father. Social History:  reports that she has never smoked. She has never used smokeless tobacco. She reports that she does not drink alcohol or use drugs.     Maternal Diabetes: Yes:  Diabetes Type:  Insulin/Medication controlled Genetic Screening: Normal Maternal Ultrasounds/Referrals: Normal Fetal Ultrasounds or other Referrals:  Referred to Materal Fetal Medicine  Maternal Substance Abuse:  No Significant Maternal Medications:  Meds include: Other: Glyburide Significant Maternal Lab Results:  Lab values include: Group B Strep negative, Rh negative Other Comments:  None  ROS Maternal Medical History:  Reason for admission: Contractions.   Contractions: Onset was 1-2 hours ago.   Frequency: regular.   Perceived severity is moderate.    Fetal activity: Perceived fetal activity is normal.   Last perceived fetal movement was within the past  hour.    Prenatal complications: Placental abnormality.   Prenatal Complications - Diabetes: gestational. Diabetes is managed by oral agent (monotherapy).      Dilation: 7 Effacement (%): 90 Station: -2 Exam by:: L. Paschal, RN Blood pressure 132/86, pulse 92, temperature 98.3 F (36.8 C), temperature source Oral, resp. rate 18, height 5\' 10"  (1.778 m), weight 222 lb (100.7 kg), last menstrual period 05/14/2015, SpO2 98 %, unknown if currently breastfeeding. Maternal Exam:  Uterine Assessment: Contraction strength is moderate.  Contraction frequency is regular.   Abdomen: Fundal height is c/w dates.   Estimated fetal weight is 7#8.       Physical Exam  Constitutional: She is oriented to person, place, and time. She appears well-developed and well-nourished.  GI: Soft. There is no rebound and no guarding.  Neurological: She is alert and oriented to person, place, and time.  Skin: Skin is warm and dry.  Psychiatric: She has a normal mood and affect. Her behavior is normal.    Prenatal labs: ABO, Rh: A/Negative/-- (04/24 0000) Antibody: Negative (04/24 0000) Rubella: Immune (04/24 0000) RPR: Nonreactive (04/24 0000)  HBsAg: Negative (04/24 0000)  HIV: Non-reactive (04/24 0000)  GBS: Negative (10/17 0000)   Assessment/Plan: 15NW 35 at 10875w1d with labor -Epidural -Anticipate NSVD -A2DM-monitor glucose and treat prn   Judy [name] 01/09/2016, 8:07 PM

## 2016-01-10 LAB — CBC
HCT: 34.5 % — ABNORMAL LOW (ref 36.0–46.0)
Hemoglobin: 11.8 g/dL — ABNORMAL LOW (ref 12.0–15.0)
MCH: 30 pg (ref 26.0–34.0)
MCHC: 34.2 g/dL (ref 30.0–36.0)
MCV: 87.8 fL (ref 78.0–100.0)
PLATELETS: 172 10*3/uL (ref 150–400)
RBC: 3.93 MIL/uL (ref 3.87–5.11)
RDW: 13.8 % (ref 11.5–15.5)
WBC: 15 10*3/uL — AB (ref 4.0–10.5)

## 2016-01-10 LAB — RPR: RPR Ser Ql: NONREACTIVE

## 2016-01-10 MED ORDER — RHO D IMMUNE GLOBULIN 1500 UNIT/2ML IJ SOSY
300.0000 ug | PREFILLED_SYRINGE | Freq: Once | INTRAMUSCULAR | Status: AC
  Administered 2016-01-10: 300 ug via INTRAVENOUS
  Filled 2016-01-10: qty 2

## 2016-01-10 NOTE — Anesthesia Postprocedure Evaluation (Signed)
Anesthesia Post Note  Patient: Judy [name]  Procedure(s) Performed: * No procedures listed *  Patient location during evaluation: Mother Baby Anesthesia Type: Epidural Level of consciousness: awake and alert Pain management: pain level controlled Vital Signs Assessment: post-procedure vital signs reviewed and stable Respiratory status: spontaneous breathing, nonlabored ventilation and respiratory function stable Cardiovascular status: stable Postop Assessment: no headache, no backache, epidural receding and patient able to bend at knees Anesthetic complications: no     Last Vitals:  Vitals:   01/10/16 0005 01/10/16 0405  BP: 123/69 122/64  Pulse: 96 67  Resp: 18 18  Temp: 36.8 C 36.6 C    Last Pain:  Vitals:   01/10/16 0503  TempSrc:   PainSc: 2    Pain Goal: Patients Stated Pain Goal: 0 (01/09/16 1931)               Rica RecordsICKELTON,Leonid Manus

## 2016-01-10 NOTE — Progress Notes (Signed)
Post Partum Day 1 Subjective: no complaints, up ad lib, voiding and tolerating PO  Objective: Blood pressure 122/64, pulse 67, temperature 97.8 F (36.6 C), temperature source Oral, resp. rate 18, height 5\' 10"  (1.778 m), weight 218 lb (98.9 kg), last menstrual period 05/14/2015, SpO2 97 %, unknown if currently breastfeeding.  Physical Exam:  General: alert, cooperative and no distress Lochia: appropriate Uterine Fundus: firm Incision: healing well DVT Evaluation: No evidence of DVT seen on physical exam.   Recent Labs  01/09/16 2012 01/10/16 0431  HGB 13.8 11.8*  HCT 38.9 34.5*    Assessment/Plan: Plan for discharge tomorrow   LOS: 1 day   Judy [name],Judy [name] E 01/10/2016, 8:40 AM

## 2016-01-11 LAB — RH IG WORKUP (INCLUDES ABO/RH)
ABO/RH(D): A NEG
Fetal Screen: NEGATIVE
GESTATIONAL AGE(WKS): 38.1
Unit division: 0

## 2016-01-11 MED ORDER — IBUPROFEN 600 MG PO TABS: 600.0000 mg | ORAL_TABLET | Freq: Four times a day (QID) | ORAL | 0 refills | Status: DC | PRN

## 2016-01-11 NOTE — Progress Notes (Signed)
Post Partum Day 2 Subjective: no complaints, up ad lib, voiding, tolerating PO and + flatus  Objective: Blood pressure 114/71, pulse 66, temperature 98.3 F (36.8 C), temperature source Oral, resp. rate 20, height 5\' 10"  (1.778 m), weight 218 lb (98.9 kg), last menstrual period 05/14/2015, SpO2 98 %, unknown if currently breastfeeding.  Physical Exam:  General: alert, cooperative and no distress Lochia: appropriate Uterine Fundus: firm Incision: healing well DVT Evaluation: No evidence of DVT seen on physical exam.   Recent Labs  01/09/16 2012 01/10/16 0431  HGB 13.8 11.8*  HCT 38.9 34.5*    Assessment/Plan: Discharge home   LOS: 2 days   Lindzee Gouge II,Lilie Vezina E 01/11/2016, 8:56 AM

## 2016-01-11 NOTE — Discharge Summary (Signed)
Obstetric Discharge Summary Reason for Admission: onset of labor Prenatal Procedures: none Intrapartum Procedures: spontaneous vaginal delivery Postpartum Procedures: none Complications-Operative and Postpartum: none Hemoglobin  Date Value Ref Range Status  01/10/2016 11.8 (L) 12.0 - 15.0 g/dL Final   HCT  Date Value Ref Range Status  01/10/2016 34.5 (L) 36.0 - 46.0 % Final    Physical Exam:  General: alert, cooperative and no distress Lochia: appropriate Uterine Fundus: firm Incision: healing well DVT Evaluation: No evidence of DVT seen on physical exam.  Discharge Diagnoses: Term Pregnancy-delivered  Discharge Information: Date: 01/11/2016 Activity: pelvic rest Diet: routine Medications: PNV and Ibuprofen Condition: stable Instructions: refer to practice specific booklet Discharge to: home   Newborn Data: Live born female  Birth Weight: 7 lb 3 oz (3260 g) APGAR: 8, 9  Home with mother.  Suhaylah Wampole II,Glorian Mcdonell E 01/11/2016, 8:57 AM

## 2016-01-16 ENCOUNTER — Inpatient Hospital Stay (HOSPITAL_COMMUNITY): Admission: RE | Admit: 2016-01-16 | Payer: BLUE CROSS/BLUE SHIELD | Source: Ambulatory Visit

## 2016-05-02 DIAGNOSIS — A09 Infectious gastroenteritis and colitis, unspecified: Secondary | ICD-10-CM

## 2016-05-04 ENCOUNTER — Telehealth: Payer: Self-pay | Admitting: Internal Medicine

## 2016-05-04 ENCOUNTER — Encounter: Payer: Self-pay | Admitting: Internal Medicine

## 2016-05-04 NOTE — Telephone Encounter (Signed)
Called gastroenteritis symptoms on Saturday midafternoon. Gastroenteritis symptoms that started early morning with vomiting diarrhea and fever. Mother has similar illness.: Zofran 4 mg #20 one by mouth every 8 hours when necessary nausea and vomiting. Clear liquids and advance diet slowly.

## 2016-10-21 DIAGNOSIS — H04123 Dry eye syndrome of bilateral lacrimal glands: Secondary | ICD-10-CM | POA: Diagnosis not present

## 2017-03-01 ENCOUNTER — Ambulatory Visit: Payer: Self-pay | Admitting: Internal Medicine

## 2017-03-04 ENCOUNTER — Ambulatory Visit: Payer: Self-pay | Admitting: Internal Medicine

## 2017-03-05 ENCOUNTER — Ambulatory Visit (INDEPENDENT_AMBULATORY_CARE_PROVIDER_SITE_OTHER): Payer: BLUE CROSS/BLUE SHIELD | Admitting: Internal Medicine

## 2017-03-05 DIAGNOSIS — Z23 Encounter for immunization: Secondary | ICD-10-CM | POA: Diagnosis not present

## 2017-03-05 NOTE — Progress Notes (Signed)
Flu vaccine given.

## 2017-03-05 NOTE — Patient Instructions (Signed)
Flu vaccine given.

## 2017-04-14 DIAGNOSIS — D1801 Hemangioma of skin and subcutaneous tissue: Secondary | ICD-10-CM | POA: Diagnosis not present

## 2017-04-14 DIAGNOSIS — L821 Other seborrheic keratosis: Secondary | ICD-10-CM | POA: Diagnosis not present

## 2017-04-14 DIAGNOSIS — L814 Other melanin hyperpigmentation: Secondary | ICD-10-CM | POA: Diagnosis not present

## 2017-04-14 DIAGNOSIS — D225 Melanocytic nevi of trunk: Secondary | ICD-10-CM | POA: Diagnosis not present

## 2017-11-08 DIAGNOSIS — H04123 Dry eye syndrome of bilateral lacrimal glands: Secondary | ICD-10-CM | POA: Diagnosis not present

## 2018-07-05 ENCOUNTER — Telehealth: Payer: Self-pay | Admitting: Internal Medicine

## 2018-07-05 NOTE — Telephone Encounter (Signed)
After speaking with Dr Lenord Fellers we scheduled a virtual visit

## 2018-07-05 NOTE — Telephone Encounter (Signed)
Ixayana Dransfield [phone]  Verdie called to say she is having some anxiety.

## 2018-07-06 ENCOUNTER — Encounter: Payer: Self-pay | Admitting: Internal Medicine

## 2018-07-06 ENCOUNTER — Ambulatory Visit (INDEPENDENT_AMBULATORY_CARE_PROVIDER_SITE_OTHER): Payer: BLUE CROSS/BLUE SHIELD | Admitting: Internal Medicine

## 2018-07-06 ENCOUNTER — Other Ambulatory Visit: Payer: Self-pay

## 2018-07-06 DIAGNOSIS — F411 Generalized anxiety disorder: Secondary | ICD-10-CM | POA: Diagnosis not present

## 2018-07-06 MED ORDER — ALPRAZOLAM 0.25 MG PO TABS: 0.2500 mg | ORAL_TABLET | Freq: Two times a day (BID) | ORAL | 0 refills | Status: DC | PRN

## 2018-07-06 NOTE — Progress Notes (Signed)
   Subjective:    Patient ID: Brigid Re, female    DOB: Feb 02, 1981, 38 y.o.   MRN: [id number]  HPI 38 year old Female with 4 small children ages 80-9 seen by interactive audio and visual telecommunications due to COVID-19 pandemic.  Patient has been having some issues sleeping.  Having some dreams.  Symptoms are intermittent.  They are not interfering all the time with daily living but seems to come and go.  Sometimes her heart rate goes up on her apple watch when she is anxious.  She is aware of her anxiety.  Trying to keep family together and make good decisions.  Husband is supportive.  Worried about the future.  Worried about food supply.  Sleep issues seem to occur after about 4 hours of sleep.    Review of Systems no significant past issues with anxiety and/or depression     Objective:   Physical Exam  Not examined.  Seen by Doxy visit.  She is alert and oriented and speech and thought patterns are normal.  Does not appear to be overly anxious today and is able to give a clear concise history of her symptoms.      Assessment & Plan:  Anxiety state  Plan: We discussed at length about how anxiety has been very common with this pandemic.  This is a normal response to the stress of the pandemic.  Sleep issues have been coming as well.  Odd dreams have been common.  She was reassured that the symptoms are not unusual with the situation at hand.  We discussed whether she needed SSRI medication or should we simply treat anxiety on a as needed basis.  We have elected to treat anxiety on a as needed basis with low-dose Xanax and see how things go.  He will take Xanax 0.25 mg up to twice daily as needed for anxiety symptoms.  She will let me know if symptoms are not controlled with this measure.  Have called in #60 Xanax 0.25 mg tablets to pharmacy with directions 1 p.o. twice daily as needed.  She may also consider breaking the medication in half if a lower dose controls her symptoms.  She  is welcome to recontact me at any time.  20 minutes spent with patient including interview, diagnosis, assessment and discussion of treatment options

## 2018-07-06 NOTE — Patient Instructions (Signed)
Trial of Xanax 0.25 mg 1/2-1 p.o. twice daily as needed for anxiety symptoms on a as needed basis.

## 2018-10-07 ENCOUNTER — Other Ambulatory Visit: Payer: Self-pay | Admitting: Sports Medicine

## 2018-10-07 DIAGNOSIS — M542 Cervicalgia: Secondary | ICD-10-CM

## 2018-10-07 DIAGNOSIS — S139XXA Sprain of joints and ligaments of unspecified parts of neck, initial encounter: Secondary | ICD-10-CM | POA: Diagnosis not present

## 2018-10-07 DIAGNOSIS — M722 Plantar fascial fibromatosis: Secondary | ICD-10-CM | POA: Diagnosis not present

## 2018-10-23 ENCOUNTER — Other Ambulatory Visit: Payer: Self-pay

## 2018-10-23 ENCOUNTER — Ambulatory Visit
Admission: RE | Admit: 2018-10-23 | Discharge: 2018-10-23 | Disposition: A | Discharge status: Home / Self Care | Payer: Self-pay | Source: Ambulatory Visit | Attending: Sports Medicine | Admitting: Sports Medicine

## 2018-10-23 DIAGNOSIS — M542 Cervicalgia: Secondary | ICD-10-CM

## 2018-10-23 DIAGNOSIS — M50223 Other cervical disc displacement at C6-C7 level: Secondary | ICD-10-CM | POA: Diagnosis not present

## 2018-10-23 DIAGNOSIS — M50222 Other cervical disc displacement at C5-C6 level: Secondary | ICD-10-CM | POA: Diagnosis not present

## 2018-10-25 DIAGNOSIS — M5106 Intervertebral disc disorders with myelopathy, lumbar region: Secondary | ICD-10-CM | POA: Diagnosis not present

## 2018-10-28 DIAGNOSIS — M542 Cervicalgia: Secondary | ICD-10-CM | POA: Diagnosis not present

## 2018-10-28 DIAGNOSIS — M50223 Other cervical disc displacement at C6-C7 level: Secondary | ICD-10-CM | POA: Diagnosis not present

## 2018-10-31 DIAGNOSIS — R52 Pain, unspecified: Secondary | ICD-10-CM | POA: Diagnosis not present

## 2018-11-02 ENCOUNTER — Other Ambulatory Visit: Payer: Self-pay | Admitting: Orthopedic Surgery

## 2018-11-02 DIAGNOSIS — M50223 Other cervical disc displacement at C6-C7 level: Secondary | ICD-10-CM | POA: Diagnosis not present

## 2018-11-02 DIAGNOSIS — M25531 Pain in right wrist: Secondary | ICD-10-CM

## 2018-11-02 DIAGNOSIS — M542 Cervicalgia: Secondary | ICD-10-CM | POA: Diagnosis not present

## 2018-11-04 DIAGNOSIS — M50223 Other cervical disc displacement at C6-C7 level: Secondary | ICD-10-CM | POA: Diagnosis not present

## 2018-11-04 DIAGNOSIS — M542 Cervicalgia: Secondary | ICD-10-CM | POA: Diagnosis not present

## 2018-11-09 DIAGNOSIS — M542 Cervicalgia: Secondary | ICD-10-CM | POA: Diagnosis not present

## 2018-11-09 DIAGNOSIS — M50223 Other cervical disc displacement at C6-C7 level: Secondary | ICD-10-CM | POA: Diagnosis not present

## 2018-11-11 ENCOUNTER — Ambulatory Visit
Admission: RE | Admit: 2018-11-11 | Discharge: 2018-11-11 | Disposition: A | Discharge status: Home / Self Care | Payer: BLUE CROSS/BLUE SHIELD | Source: Ambulatory Visit | Attending: Orthopedic Surgery | Admitting: Orthopedic Surgery

## 2018-11-11 DIAGNOSIS — M542 Cervicalgia: Secondary | ICD-10-CM | POA: Diagnosis not present

## 2018-11-11 DIAGNOSIS — M25531 Pain in right wrist: Secondary | ICD-10-CM

## 2018-11-11 DIAGNOSIS — M50223 Other cervical disc displacement at C6-C7 level: Secondary | ICD-10-CM | POA: Diagnosis not present

## 2018-11-14 ENCOUNTER — Other Ambulatory Visit: Payer: Self-pay | Admitting: Sports Medicine

## 2018-11-14 DIAGNOSIS — M545 Low back pain, unspecified: Secondary | ICD-10-CM

## 2018-11-16 DIAGNOSIS — M50223 Other cervical disc displacement at C6-C7 level: Secondary | ICD-10-CM | POA: Diagnosis not present

## 2018-11-16 DIAGNOSIS — R52 Pain, unspecified: Secondary | ICD-10-CM | POA: Diagnosis not present

## 2018-11-16 DIAGNOSIS — M25331 Other instability, right wrist: Secondary | ICD-10-CM | POA: Diagnosis not present

## 2018-11-16 DIAGNOSIS — M542 Cervicalgia: Secondary | ICD-10-CM | POA: Diagnosis not present

## 2018-11-18 DIAGNOSIS — M50223 Other cervical disc displacement at C6-C7 level: Secondary | ICD-10-CM | POA: Diagnosis not present

## 2018-11-18 DIAGNOSIS — M542 Cervicalgia: Secondary | ICD-10-CM | POA: Diagnosis not present

## 2018-11-22 DIAGNOSIS — H04123 Dry eye syndrome of bilateral lacrimal glands: Secondary | ICD-10-CM | POA: Diagnosis not present

## 2018-11-22 DIAGNOSIS — H5203 Hypermetropia, bilateral: Secondary | ICD-10-CM | POA: Diagnosis not present

## 2018-11-23 DIAGNOSIS — M542 Cervicalgia: Secondary | ICD-10-CM | POA: Diagnosis not present

## 2018-11-23 DIAGNOSIS — M50223 Other cervical disc displacement at C6-C7 level: Secondary | ICD-10-CM | POA: Diagnosis not present

## 2018-11-24 ENCOUNTER — Ambulatory Visit (INDEPENDENT_AMBULATORY_CARE_PROVIDER_SITE_OTHER): Payer: BLUE CROSS/BLUE SHIELD | Admitting: Orthopedic Surgery

## 2018-11-24 ENCOUNTER — Other Ambulatory Visit: Payer: Self-pay | Admitting: Orthopedic Surgery

## 2018-11-24 ENCOUNTER — Ambulatory Visit: Payer: Self-pay

## 2018-11-24 ENCOUNTER — Other Ambulatory Visit: Payer: Self-pay

## 2018-11-24 ENCOUNTER — Encounter: Payer: Self-pay | Admitting: Orthopedic Surgery

## 2018-11-24 VITALS — Ht 70.0 in | Wt 218.0 lb

## 2018-11-24 DIAGNOSIS — G8929 Other chronic pain: Secondary | ICD-10-CM | POA: Diagnosis not present

## 2018-11-24 DIAGNOSIS — M25571 Pain in right ankle and joints of right foot: Secondary | ICD-10-CM

## 2018-11-24 DIAGNOSIS — M79672 Pain in left foot: Secondary | ICD-10-CM

## 2018-11-24 DIAGNOSIS — M25572 Pain in left ankle and joints of left foot: Secondary | ICD-10-CM | POA: Diagnosis not present

## 2018-11-25 ENCOUNTER — Other Ambulatory Visit: Payer: Self-pay | Admitting: Orthopedic Surgery

## 2018-11-25 ENCOUNTER — Encounter: Payer: Self-pay | Admitting: Orthopedic Surgery

## 2018-11-25 DIAGNOSIS — M25331 Other instability, right wrist: Secondary | ICD-10-CM

## 2018-11-25 NOTE — Progress Notes (Signed)
Office Visit Note   Patient: Judy [name]           Date of Birth: [birth year]           MRN: [id number] Visit Date: 11/24/2018              Requested by: Elby Showers, MD [address] [place],  [place] [id number] PCP: Elby Showers, MD  Chief Complaint  Patient presents with  . Left Foot - Pain  . Right Ankle - Pain  . Left Ankle - Pain      HPI: Patient is a 38 year old woman who presents complaining of bilateral ankle pain as well as left lateral sided foot pain.  She states she wears new balance shoes and she states she has the worst pain with start up in the morning for the first 10 steps that then gets better.  Also has start up pain after prolonged rest.  Assessment & Plan: Visit Diagnoses:  1. Chronic pain of both ankles   2. Pain in left foot     Plan: Discussed that with her cavus foot and plantarflexed first ray she is supinating her foot walking the subtalar joint and applying extra pressure along the lateral border of her foot.  Recommended stiff soled sneakers orthotics with a good arch and metatarsal bar and cut out the first metatarsal head area to allow for plantarflexion of the first ray to get her hindfoot out of varus and to neutral or valgus.  Follow-Up Instructions: Return if symptoms worsen or fail to improve.   Ortho Exam  Patient is alert, oriented, no adenopathy, well-dressed, normal affect, normal respiratory effort. Patient has a plantarflexed first ray placing her hindfoot into varus she has a cavus foot.  She is tender to palpation over the fifth metatarsal she has no pain to palpation over the ankle no tenderness to palpation over the subtalar joint she has good pulses anterior drawer is stable.  She does have a little bit of tenderness over the peroneal tendons from the varus hindfoot.  Patient has no redness no swelling no signs of inflammatory arthropathy.  Imaging: No results found. No images are attached to the encounter.   Labs: Lab Results  Component Value Date   ESRSEDRATE 10 08/29/2010   [facility] ESCHERICHIA COLI 10/09/2011     Lab Results  Component Value Date   ALBUMIN 2.4 (L) 01/08/2015   ALBUMIN 4.1 09/23/2010   ALBUMIN 4.5 08/29/2010    No results found for: MG No results found for: VD25OH  No results found for: PREALBUMIN CBC EXTENDED Latest Ref Rng & Units 01/10/2016 01/09/2016 01/08/2015  WBC 4.0 - 10.5 K/uL 15.0(H) 10.1 10.6(H)  RBC 3.87 - 5.11 MIL/uL 3.93 4.50 4.33  HGB 12.0 - 15.0 g/dL 11.8(L) 13.8 13.1  HCT 36.0 - 46.0 % 34.5(L) 38.9 38.8  PLT 150 - 400 K/uL 172 196 166  NEUTROABS 1.7 - 7.7 K/uL - - -  LYMPHSABS 0.7 - 4.0 K/uL - - -     Body mass index is 31.28 kg/m.  Orders:  Orders Placed This Encounter  Procedures  . XR Foot 2 Views Left  . XR Ankle 2 Views Right  . XR Ankle 2 Views Left   No orders of the defined types were placed in this encounter.    Procedures: No procedures performed  Clinical Data: No additional findings.  ROS:  All other systems negative, except as noted in the HPI. Review of Systems  Objective: Vital Signs:  Ht 5\' 10"  (1.778 m)   Wt 218 lb (98.9 kg)   BMI 31.28 kg/m   Specialty Comments:  No specialty comments available.  PMFS History: Patient Active Problem List   Diagnosis Date Noted  . Active labor 01/06/2015  . Palpitations 09/21/2012  . Pregnancy 09/21/2012   Past Medical History:  Diagnosis Date  . Constipation   . Diabetes mellitus without complication (HCC)   . Epigastric pain   . Gallstones    Recently diagnosed by Dr. Jarold MottoPatterson.  . Gestational diabetes   . Headache(784.0)   . Hx of varicella   . Hyperlipidemia   . Rash    Bruises easily, per medical history form dated 09/15/10    Family History  Problem Relation Age of Onset  . Diabetes Maternal Grandfather   . Heart disease Father   . Heart attack Father   . Fibromyalgia Maternal Aunt   . Diabetes Maternal Aunt     Past Surgical History:   Procedure Laterality Date  . CHOLECYSTECTOMY    . RHINOPLASTY     Social History   Occupational History  . Occupation: Real Environmental managerstate    Employer: [employer]  Tobacco Use  . Smoking status: Never Smoker  . Smokeless tobacco: Never Used  Substance and Sexual Activity  . Alcohol use: No  . Drug use: No  . Sexual activity: Yes    Birth control/protection: None

## 2018-12-07 ENCOUNTER — Other Ambulatory Visit: Payer: BLUE CROSS/BLUE SHIELD

## 2018-12-08 ENCOUNTER — Other Ambulatory Visit: Payer: Self-pay

## 2018-12-08 ENCOUNTER — Ambulatory Visit
Admission: RE | Admit: 2018-12-08 | Discharge: 2018-12-08 | Disposition: A | Discharge status: Home / Self Care | Payer: BC Managed Care – PPO | Source: Ambulatory Visit | Attending: Orthopedic Surgery | Admitting: Orthopedic Surgery

## 2018-12-08 DIAGNOSIS — M25531 Pain in right wrist: Secondary | ICD-10-CM | POA: Diagnosis not present

## 2018-12-08 DIAGNOSIS — S63091A Other subluxation of right wrist and hand, initial encounter: Secondary | ICD-10-CM | POA: Diagnosis not present

## 2018-12-08 DIAGNOSIS — M25331 Other instability, right wrist: Secondary | ICD-10-CM

## 2018-12-08 MED ORDER — IOPAMIDOL (ISOVUE-M 200) INJECTION 41%
2.0000 mL | Freq: Once | INTRAMUSCULAR | Status: AC
  Administered 2018-12-08: 2 mL via INTRA_ARTICULAR

## 2018-12-09 ENCOUNTER — Other Ambulatory Visit: Payer: Self-pay | Admitting: Internal Medicine

## 2018-12-09 MED ORDER — ALPRAZOLAM 0.25 MG PO TABS: 0.2500 mg | ORAL_TABLET | Freq: Two times a day (BID) | ORAL | 0 refills | Status: DC | PRN

## 2018-12-09 NOTE — Telephone Encounter (Signed)
Pt wants to get a refill of ALPRAZolam (XANAX) 0.25 MG tablet, she said she has zero refills

## 2018-12-09 NOTE — Addendum Note (Signed)
Addended by: Mady Haagensen on: 12/09/2018 02:31 PM   Modules accepted: Orders

## 2018-12-09 NOTE — Telephone Encounter (Signed)
Lat OV 07/03/2018 Last refill 07/06/2018 #60  Scheduled CPE in December  rx pended

## 2018-12-10 ENCOUNTER — Other Ambulatory Visit: Payer: Self-pay

## 2018-12-10 ENCOUNTER — Ambulatory Visit
Admission: RE | Admit: 2018-12-10 | Discharge: 2018-12-10 | Disposition: A | Discharge status: Home / Self Care | Payer: BC Managed Care – PPO | Source: Ambulatory Visit | Attending: Sports Medicine | Admitting: Sports Medicine

## 2018-12-10 DIAGNOSIS — M545 Low back pain, unspecified: Secondary | ICD-10-CM

## 2018-12-12 DIAGNOSIS — M545 Low back pain: Secondary | ICD-10-CM | POA: Diagnosis not present

## 2018-12-14 ENCOUNTER — Ambulatory Visit (INDEPENDENT_AMBULATORY_CARE_PROVIDER_SITE_OTHER): Payer: BC Managed Care – PPO | Admitting: Internal Medicine

## 2018-12-14 ENCOUNTER — Other Ambulatory Visit: Payer: Self-pay

## 2018-12-14 DIAGNOSIS — Z23 Encounter for immunization: Secondary | ICD-10-CM

## 2018-12-14 DIAGNOSIS — M25331 Other instability, right wrist: Secondary | ICD-10-CM | POA: Diagnosis not present

## 2018-12-14 NOTE — Progress Notes (Signed)
Flu vaccine per CMA 

## 2018-12-14 NOTE — Patient Instructions (Signed)
Patient received a flu vaccine IM L deltoid, AV, CMA  

## 2018-12-21 ENCOUNTER — Telehealth: Payer: Self-pay | Admitting: Internal Medicine

## 2018-12-21 ENCOUNTER — Encounter: Payer: Self-pay | Admitting: Internal Medicine

## 2018-12-21 NOTE — Telephone Encounter (Signed)
Received call from patient asking for prescription for Xanax. Has not taken this in sometime but pandemic has taken toll especially with small children. Has tolerated this in past.  Have e-prescribed using [employer] device Xanax 0.25 mg #60 tablets one po bid prn anxiety. If further prescription refills needed, will need office visit. Time spent- phone call and electronic prescribing plus reviewing medical chart 10 minutes.

## 2019-01-03 DIAGNOSIS — L578 Other skin changes due to chronic exposure to nonionizing radiation: Secondary | ICD-10-CM | POA: Diagnosis not present

## 2019-01-13 DIAGNOSIS — M25331 Other instability, right wrist: Secondary | ICD-10-CM | POA: Diagnosis not present

## 2019-02-12 DIAGNOSIS — R432 Parageusia: Secondary | ICD-10-CM | POA: Diagnosis not present

## 2019-02-12 DIAGNOSIS — R0989 Other specified symptoms and signs involving the circulatory and respiratory systems: Secondary | ICD-10-CM | POA: Diagnosis not present

## 2019-02-12 DIAGNOSIS — Z20828 Contact with and (suspected) exposure to other viral communicable diseases: Secondary | ICD-10-CM | POA: Diagnosis not present

## 2019-02-13 ENCOUNTER — Ambulatory Visit (INDEPENDENT_AMBULATORY_CARE_PROVIDER_SITE_OTHER): Payer: BC Managed Care – PPO | Admitting: Internal Medicine

## 2019-02-13 ENCOUNTER — Telehealth: Payer: Self-pay | Admitting: Internal Medicine

## 2019-02-13 ENCOUNTER — Encounter: Payer: Self-pay | Admitting: Internal Medicine

## 2019-02-13 ENCOUNTER — Other Ambulatory Visit: Payer: Self-pay

## 2019-02-13 VITALS — Temp 98.7°F | Ht 70.0 in

## 2019-02-13 DIAGNOSIS — Z20828 Contact with and (suspected) exposure to other viral communicable diseases: Secondary | ICD-10-CM | POA: Diagnosis not present

## 2019-02-13 DIAGNOSIS — Z20822 Contact with and (suspected) exposure to covid-19: Secondary | ICD-10-CM

## 2019-02-13 NOTE — Telephone Encounter (Signed)
OK 

## 2019-02-13 NOTE — Telephone Encounter (Signed)
Judy [name] [phone]  Seriah called to say she is having chest congestion, cough and loss of appetite that started yesterday, fever has been as high as 99.6, she was exposed to a positive COVID on Wednesday of last week. Scheduled virtual visit for this afternoon.

## 2019-02-13 NOTE — Patient Instructions (Signed)
Patient is to have PCR testing at test site at [facility].  Is to remain quarantined until test results are back.  Rest and drink plenty of fluids.  Monitor herself for shortness of breath.  Take Tylenol for fever.

## 2019-02-13 NOTE — Progress Notes (Signed)
   Subjective:    Patient ID: Judy [name], female    DOB: 12-Jul-1980, 38 y.o.   MRN: [id number]  HPI 38 year old Female seen today by interactive audio and video telecommunications due to the coronavirus pandemic.  She is identified using 2 identifiers as Judy [name], a longstanding patient in this practice and she is agreeable to visit in this format today.  Patient notes that she has been very careful during the pandemic to avoid COVID-19 exposure but on Wednesday, November 18 she was exposed to an Art therapist in a Pilates class who subsequently tested positive.  She was with her sister.  She notes that they were socially distanced but were not wearing masks.  She had a negative rapid test yesterday but has developed respiratory infection symptoms starting on November 19.  She has noticed some dysgeusia.  Has had temperature 99.6 degrees.  No productive cough but has what she describes as a "painful "cough and is hoarse.  On Friday, November 20th she had sore throat and postnasal drip.    Review of Systems no nausea vomiting or significant myalgias.  Low-grade fever present.     Objective:   Physical Exam Seen today virtually in no acute distress but sounds hoarse with respiratory congestion.  Looks a bit pale.  Looks fatigued.       Assessment & Plan:  Close exposure to COVID-19  Plan: Patient had a rapid test that was negative but I am recommending that she have PCR test today or tomorrow at tent testing site at [facility].  Is to stay quarantined until test results are known.  Rest and drink plenty of fluids.  Tylenol if needed for fever.  Patient is to monitor for respiratory distress such as shortness of breath or wheezing.

## 2019-02-14 ENCOUNTER — Other Ambulatory Visit: Payer: Self-pay

## 2019-02-14 DIAGNOSIS — Z20822 Contact with and (suspected) exposure to covid-19: Secondary | ICD-10-CM

## 2019-02-16 LAB — NOVEL CORONAVIRUS, NAA: SARS-CoV-2, NAA: DETECTED — AB

## 2019-02-20 ENCOUNTER — Emergency Department (HOSPITAL_COMMUNITY): Payer: BC Managed Care – PPO

## 2019-02-20 ENCOUNTER — Other Ambulatory Visit: Payer: Self-pay

## 2019-02-20 ENCOUNTER — Emergency Department (HOSPITAL_COMMUNITY)
Admission: EM | Admit: 2019-02-20 | Discharge: 2019-02-20 | Disposition: A | Discharge status: Home / Self Care | Payer: BC Managed Care – PPO | Attending: Emergency Medicine | Admitting: Emergency Medicine

## 2019-02-20 ENCOUNTER — Encounter (HOSPITAL_COMMUNITY): Payer: Self-pay | Admitting: Emergency Medicine

## 2019-02-20 DIAGNOSIS — E860 Dehydration: Secondary | ICD-10-CM | POA: Diagnosis not present

## 2019-02-20 DIAGNOSIS — U071 COVID-19: Secondary | ICD-10-CM | POA: Diagnosis not present

## 2019-02-20 DIAGNOSIS — Z79899 Other long term (current) drug therapy: Secondary | ICD-10-CM | POA: Diagnosis not present

## 2019-02-20 DIAGNOSIS — R509 Fever, unspecified: Secondary | ICD-10-CM | POA: Diagnosis not present

## 2019-02-20 DIAGNOSIS — R0602 Shortness of breath: Secondary | ICD-10-CM | POA: Diagnosis not present

## 2019-02-20 DIAGNOSIS — E119 Type 2 diabetes mellitus without complications: Secondary | ICD-10-CM | POA: Diagnosis not present

## 2019-02-20 DIAGNOSIS — R531 Weakness: Secondary | ICD-10-CM | POA: Diagnosis not present

## 2019-02-20 DIAGNOSIS — R05 Cough: Secondary | ICD-10-CM | POA: Diagnosis not present

## 2019-02-20 LAB — COMPREHENSIVE METABOLIC PANEL
ALT: 56 U/L — ABNORMAL HIGH (ref 0–44)
AST: 34 U/L (ref 15–41)
Albumin: 4 g/dL (ref 3.5–5.0)
Alkaline Phosphatase: 46 U/L (ref 38–126)
Anion gap: 10 (ref 5–15)
BUN: 13 mg/dL (ref 6–20)
CO2: 21 mmol/L — ABNORMAL LOW (ref 22–32)
Calcium: 8.7 mg/dL — ABNORMAL LOW (ref 8.9–10.3)
Chloride: 103 mmol/L (ref 98–111)
Creatinine, Ser: 0.63 mg/dL (ref 0.44–1.00)
GFR calc Af Amer: >60 mL/min (ref >60)
GFR calc non Af Amer: >60 mL/min (ref >60)
Glucose, Bld: 143 mg/dL — ABNORMAL HIGH (ref 70–99)
Potassium: 3.3 mmol/L — ABNORMAL LOW (ref 3.5–5.1)
Sodium: 134 mmol/L — ABNORMAL LOW (ref 135–145)
Total Bilirubin: 0.4 mg/dL (ref 0.3–1.2)
Total Protein: 7.5 g/dL (ref 6.5–8.1)

## 2019-02-20 LAB — CBC WITH DIFFERENTIAL/PLATELET
Abs Immature Granulocytes: 0.02 10*3/uL (ref 0.00–0.07)
Basophils Absolute: 0 10*3/uL (ref 0.0–0.1)
Basophils Relative: 0 %
Eosinophils Absolute: 0 10*3/uL (ref 0.0–0.5)
Eosinophils Relative: 0 %
HCT: 46.1 % — ABNORMAL HIGH (ref 36.0–46.0)
Hemoglobin: 15.7 g/dL — ABNORMAL HIGH (ref 12.0–15.0)
Immature Granulocytes: 0 %
Lymphocytes Relative: 26 %
Lymphs Abs: 1.4 10*3/uL (ref 0.7–4.0)
MCH: 28.4 pg (ref 26.0–34.0)
MCHC: 34.1 g/dL (ref 30.0–36.0)
MCV: 83.4 fL (ref 80.0–100.0)
Monocytes Absolute: 0.3 10*3/uL (ref 0.1–1.0)
Monocytes Relative: 6 %
Neutro Abs: 3.8 10*3/uL (ref 1.7–7.7)
Neutrophils Relative %: 68 %
Platelets: 154 10*3/uL (ref 150–400)
RBC: 5.53 MIL/uL — ABNORMAL HIGH (ref 3.87–5.11)
RDW: 11.8 % (ref 11.5–15.5)
WBC: 5.5 10*3/uL (ref 4.0–10.5)
nRBC: 0 % (ref 0.0–0.2)

## 2019-02-20 MED ORDER — POTASSIUM CHLORIDE CRYS ER 20 MEQ PO TBCR
40.0000 meq | EXTENDED_RELEASE_TABLET | Freq: Once | ORAL | Status: AC
  Administered 2019-02-20: 40 meq via ORAL
  Filled 2019-02-20: qty 2

## 2019-02-20 MED ORDER — LACTATED RINGERS IV BOLUS
1000.0000 mL | Freq: Once | INTRAVENOUS | Status: AC
  Administered 2019-02-20: 1000 mL via INTRAVENOUS

## 2019-02-20 MED ORDER — KETOROLAC TROMETHAMINE 15 MG/ML IJ SOLN
15.0000 mg | Freq: Once | INTRAMUSCULAR | Status: AC
  Administered 2019-02-20: 15 mg via INTRAVENOUS
  Filled 2019-02-20: qty 1

## 2019-02-20 MED ORDER — LACTATED RINGERS IV BOLUS
1000.0000 mL | Freq: Once | INTRAVENOUS | Status: AC
  Administered 2019-02-20: 12:00:00 1000 mL via INTRAVENOUS

## 2019-02-20 NOTE — ED Provider Notes (Signed)
[facility]-EMERGENCY DEPT Provider Note   CSN: [id number] Arrival date & time: 02/20/19  1117     History   Chief Complaint Chief Complaint  Patient presents with  . COVID    HPI Judy [name] is a 38 y.o. female.     HPI  38 year old female presents with generalized weakness.  She was diagnosed with Covid last week.  She is overall been having symptoms for about 10 days.  Has 5/10 headache, mild intermittent cough, generalized weakness and fever.  Last temperature above 100 was yesterday at about 102.  Has been taking Tylenol on and off for the headache and fever.  She has also lost her sense of taste and smell and thinks she is eating and drinking less because of this.  Some dizziness fainting.  Past Medical History:  Diagnosis Date  . Constipation   . Diabetes mellitus without complication (HCC)   . Epigastric pain   . Gallstones    Recently diagnosed by Dr. Jarold MottoPatterson.  . Gestational diabetes   . Headache(784.0)   . Hx of varicella   . Hyperlipidemia   . Rash    Bruises easily, per medical history form dated 09/15/10    Patient Active Problem List   Diagnosis Date Noted  . Active labor 01/06/2015  . Palpitations 09/21/2012  . Pregnancy 09/21/2012    Past Surgical History:  Procedure Laterality Date  . CHOLECYSTECTOMY    . RHINOPLASTY       OB History    Gravida  6   Para  4   Term  3   Preterm  1   AB  2   Living  4     SAB  1   TAB  1   Ectopic      Multiple  0   Live Births  4            Home Medications    Prior to Admission medications   Medication Sig Start Date End Date Taking? Authorizing Provider  ALPRAZolam (XANAX) 0.25 MG tablet Take 1 tablet (0.25 mg total) by mouth 2 (two) times daily as needed for anxiety. 12/09/18   Margaree MackintoshBaxley, Mary J, MD  ibuprofen (ADVIL,MOTRIN) 600 MG tablet Take 1 tablet (600 mg total) by mouth every 6 (six) hours as needed. 01/11/16   Harold Hedgeomblin, James, MD  Prenatal Vit-Fe  Fumarate-FA (PRENATAL MULTIVITAMIN) TABS tablet Take 1 tablet by mouth at bedtime.    [provider]    Family History Family History  Problem Relation Age of Onset  . Diabetes Maternal Grandfather   . Heart disease Father   . Heart attack Father   . Fibromyalgia Maternal Aunt   . Diabetes Maternal Aunt     Social History Social History   Tobacco Use  . Smoking status: Never Smoker  . Smokeless tobacco: Never Used  Substance Use Topics  . Alcohol use: No  . Drug use: No     Allergies   Patient has no known allergies.   Review of Systems Review of Systems  Constitutional: Positive for fever.  Respiratory: Positive for cough and shortness of breath (mild).   Cardiovascular: Negative for chest pain.  Gastrointestinal: Negative for abdominal pain, diarrhea and vomiting.  Genitourinary: Negative for dysuria.  Neurological: Positive for weakness (generalized) and headaches.  All other systems reviewed and are negative.    Physical Exam Updated Vital Signs BP 113/82 (BP Location: Left Arm)   Pulse 100   Temp 98.1 F (  36.7 C) (Oral)   Resp 19   Ht 5\' 10"  (1.778 m)   Wt 86.2 kg   LMP 02/20/2019   SpO2 100%   BMI 27.26 kg/m   Physical Exam Vitals signs and nursing note reviewed.  Constitutional:      General: She is not in acute distress.    Appearance: She is well-developed. She is not ill-appearing or diaphoretic.  HENT:     Head: Normocephalic and atraumatic.     Right Ear: External ear normal.     Left Ear: External ear normal.     Nose: Nose normal.  Eyes:     General:        Right eye: No discharge.        Left eye: No discharge.     Extraocular Movements: Extraocular movements intact.     Pupils: Pupils are equal, round, and reactive to light.  Neck:     Musculoskeletal: Normal range of motion and neck supple.  Cardiovascular:     Rate and Rhythm: Regular rhythm. Tachycardia present.     Heart sounds: Normal heart sounds.  Pulmonary:      Effort: Pulmonary effort is normal.     Breath sounds: Normal breath sounds.  Abdominal:     Palpations: Abdomen is soft.     Tenderness: There is no abdominal tenderness.  Skin:    General: Skin is warm and dry.  Neurological:     Mental Status: She is alert.     Comments: CN 3-12 grossly intact. 5/5 strength in all 4 extremities. Grossly normal sensation. Normal finger to nose.   Psychiatric:        Mood and Affect: Mood is not anxious.      ED Treatments / Results  Labs (all labs ordered are listed, but only abnormal results are displayed) Labs Reviewed  COMPREHENSIVE METABOLIC PANEL - Abnormal; Notable for the following components:      Result Value   Sodium 134 (*)    Potassium 3.3 (*)    CO2 21 (*)    Glucose, Bld 143 (*)    Calcium 8.7 (*)    ALT 56 (*)    All other components within normal limits  CBC WITH DIFFERENTIAL/PLATELET - Abnormal; Notable for the following components:   RBC 5.53 (*)    Hemoglobin 15.7 (*)    HCT 46.1 (*)    All other components within normal limits  I-STAT BETA HCG BLOOD, ED ([facility], [facility], [facility] ONLY)    EKG EKG Interpretation  Date/Time:  Monday February 20 2019 11:27:37 EST Ventricular Rate:  124 PR Interval:    QRS Duration: 87 QT Interval:  310 QTC Calculation: 446 R Axis:   118 Text Interpretation: Sinus tachycardia Right axis deviation Low voltage, precordial leads No old tracing to compare Confirmed by [id number] [phone]) on 02/20/2019 11:29:40 AM   Radiology Dg Chest Portable 1 View  Result Date: 02/20/2019 CLINICAL DATA:  Cough and shortness of breath with fever EXAM: PORTABLE CHEST 1 VIEW COMPARISON:  None. FINDINGS: Lungs are clear. Heart size and pulmonary vascularity are normal. No adenopathy. No bone lesions. IMPRESSION: Lungs are clear.  No evident adenopathy. Electronically Signed   By: 02/22/2019 III M.D.   On: 02/20/2019 15:00    Procedures Procedures (including critical care time)  Medications  Ordered in ED Medications  lactated ringers bolus 1,000 mL (0 mLs Intravenous Stopped 02/20/19 1432)  lactated ringers bolus 1,000 mL (0 mLs Intravenous Stopped 02/20/19 1432)  ketorolac (TORADOL) 15 MG/ML injection 15 mg (15 mg Intravenous Given 02/20/19 1222)  potassium chloride SA (KLOR-CON) CR tablet 40 mEq (40 mEq Oral Given 02/20/19 1551)     Initial Impression / Assessment and Plan / ED Course  I have reviewed the triage vital signs and the nursing notes.  Pertinent labs & imaging results that were available during my care of the patient were reviewed by me and considered in my medical decision making (see chart for details).        The patient's heart rate has improved with IV fluids and Toradol.  She is feeling much better.  She is not hypoxic or having increased work of breathing.  Chest x-ray is clear.  I think she is definitely symptomatic from Covid but likely this is more dehydration than anything else.  Labs are pretty reassuring besides mild hypokalemia.  This will be repleted.  Otherwise she was advised to continue quarantining and we discussed return precautions.  Judy [name] was evaluated in Emergency Department on 02/20/2019 for the symptoms described in the history of present illness. She was evaluated in the context of the global COVID-19 pandemic, which necessitated consideration that the patient might be at risk for infection with the SARS-CoV-2 virus that causes COVID-19. Institutional protocols and algorithms that pertain to the evaluation of patients at risk for COVID-19 are in a state of rapid change based on information released by regulatory bodies including the CDC and federal and state organizations. These policies and algorithms were followed during the patient's care in the ED.   Final Clinical Impressions(s) / ED Diagnoses   Final diagnoses:  COVID-19 virus infection  Dehydration    ED Discharge Orders    None       Sherwood Gambler, MD  02/20/19 1643

## 2019-02-20 NOTE — Discharge Instructions (Signed)
Be sure to drink plenty of fluids. Take ibuprofen and tylenol for pain. If you develop new or worsening shortness of breath, vomiting, or any other new/concerning symptoms then return to the ER or call 911.

## 2019-02-20 NOTE — ED Triage Notes (Signed)
Pt has had COVID symptoms since 10/21; primary care sent her here related to no improvement. Pt verbalizing continued fever, cough, SOB, headache, sore throat, and weakness.

## 2019-03-09 ENCOUNTER — Other Ambulatory Visit (INDEPENDENT_AMBULATORY_CARE_PROVIDER_SITE_OTHER): Payer: BC Managed Care – PPO | Admitting: Internal Medicine

## 2019-03-09 DIAGNOSIS — Z13220 Encounter for screening for lipoid disorders: Secondary | ICD-10-CM

## 2019-03-09 DIAGNOSIS — R059 Cough, unspecified: Secondary | ICD-10-CM

## 2019-03-09 DIAGNOSIS — R05 Cough: Secondary | ICD-10-CM | POA: Diagnosis not present

## 2019-03-09 DIAGNOSIS — U071 COVID-19: Secondary | ICD-10-CM | POA: Diagnosis not present

## 2019-03-09 NOTE — Progress Notes (Addendum)
Patient was recently diagnosed with Covid-19 and is now here for CPE labs. Was tested November 24. We have asked that she get Covid-19 test today. CPE appt is next week. Labs were not drawn today and can be done next week if she tests negative. I went to her car and personally tested her today after reviewing medical records. Pt agreed to be tested.Still has slight cough.

## 2019-03-09 NOTE — Addendum Note (Signed)
Addended by: Elby Showers on: 03/09/2019 10:07 AM   Modules accepted: Level of Service

## 2019-03-11 LAB — SARS-COV-2 RNA,(COVID-19) QUALITATIVE NAAT: SARS CoV2 RNA: DETECTED — AB

## 2019-03-14 ENCOUNTER — Encounter: Payer: Self-pay | Admitting: Internal Medicine

## 2019-03-15 DIAGNOSIS — M25331 Other instability, right wrist: Secondary | ICD-10-CM | POA: Diagnosis not present

## 2019-03-15 DIAGNOSIS — M25531 Pain in right wrist: Secondary | ICD-10-CM | POA: Diagnosis not present

## 2019-04-14 ENCOUNTER — Other Ambulatory Visit: Payer: BC Managed Care – PPO | Admitting: Internal Medicine

## 2019-04-14 ENCOUNTER — Other Ambulatory Visit: Payer: Self-pay

## 2019-04-14 DIAGNOSIS — Z13220 Encounter for screening for lipoid disorders: Secondary | ICD-10-CM

## 2019-04-14 DIAGNOSIS — Z1329 Encounter for screening for other suspected endocrine disorder: Secondary | ICD-10-CM | POA: Diagnosis not present

## 2019-04-14 DIAGNOSIS — Z0000 Encounter for general adult medical examination without abnormal findings: Secondary | ICD-10-CM | POA: Diagnosis not present

## 2019-04-14 DIAGNOSIS — F411 Generalized anxiety disorder: Secondary | ICD-10-CM

## 2019-04-14 DIAGNOSIS — Z1321 Encounter for screening for nutritional disorder: Secondary | ICD-10-CM

## 2019-04-15 LAB — COMPLETE METABOLIC PANEL WITH GFR
AG Ratio: 1.6 (calc) (ref 1.0–2.5)
ALT: 29 U/L (ref 6–29)
AST: 15 U/L (ref 10–30)
Albumin: 4.4 g/dL (ref 3.6–5.1)
Alkaline phosphatase (APISO): 42 U/L (ref 31–125)
BUN: 16 mg/dL (ref 7–25)
CO2: 24 mmol/L (ref 20–32)
Calcium: 9.6 mg/dL (ref 8.6–10.2)
Chloride: 106 mmol/L (ref 98–110)
Creat: 0.8 mg/dL (ref 0.50–1.10)
GFR, Est African American: 109 mL/min/{1.73_m2} (ref >=60)
GFR, Est Non African American: 94 mL/min/{1.73_m2} (ref >=60)
Globulin: 2.7 g/dL (calc) (ref 1.9–3.7)
Glucose, Bld: 97 mg/dL (ref 65–99)
Potassium: 4.4 mmol/L (ref 3.5–5.3)
Sodium: 138 mmol/L (ref 135–146)
Total Bilirubin: 0.4 mg/dL (ref 0.2–1.2)
Total Protein: 7.1 g/dL (ref 6.1–8.1)

## 2019-04-15 LAB — CBC WITH DIFFERENTIAL/PLATELET
Absolute Monocytes: 387 cells/uL (ref 200–950)
Basophils Absolute: 51 cells/uL (ref 0–200)
Basophils Relative: 0.7 %
Eosinophils Absolute: 197 cells/uL (ref 15–500)
Eosinophils Relative: 2.7 %
HCT: 42.6 % (ref 35.0–45.0)
Hemoglobin: 14.5 g/dL (ref 11.7–15.5)
Lymphs Abs: 2380 cells/uL (ref 850–3900)
MCH: 29.5 pg (ref 27.0–33.0)
MCHC: 34 g/dL (ref 32.0–36.0)
MCV: 86.6 fL (ref 80.0–100.0)
MPV: 10.2 fL (ref 7.5–12.5)
Monocytes Relative: 5.3 %
Neutro Abs: 4285 cells/uL (ref 1500–7800)
Neutrophils Relative %: 58.7 %
Platelets: 249 10*3/uL (ref 140–400)
RBC: 4.92 10*6/uL (ref 3.80–5.10)
RDW: 13.3 % (ref 11.0–15.0)
Total Lymphocyte: 32.6 %
WBC: 7.3 10*3/uL (ref 3.8–10.8)

## 2019-04-15 LAB — TSH: TSH: 0.8 mIU/L

## 2019-04-15 LAB — LIPID PANEL
Cholesterol: 210 mg/dL — ABNORMAL HIGH (ref <200)
HDL: 57 mg/dL (ref >=50)
LDL Cholesterol (Calc): 139 mg/dL (calc) — ABNORMAL HIGH
Non-HDL Cholesterol (Calc): 153 mg/dL (calc) — ABNORMAL HIGH (ref <130)
Total CHOL/HDL Ratio: 3.7 (calc) (ref <5.0)
Triglycerides: 58 mg/dL (ref <150)

## 2019-04-15 LAB — VITAMIN D 25 HYDROXY (VIT D DEFICIENCY, FRACTURES): Vit D, 25-Hydroxy: 11 ng/mL — ABNORMAL LOW (ref 30–100)

## 2019-04-17 ENCOUNTER — Ambulatory Visit (INDEPENDENT_AMBULATORY_CARE_PROVIDER_SITE_OTHER): Payer: BC Managed Care – PPO | Admitting: Internal Medicine

## 2019-04-17 ENCOUNTER — Other Ambulatory Visit: Payer: Self-pay

## 2019-04-17 ENCOUNTER — Encounter: Payer: Self-pay | Admitting: Internal Medicine

## 2019-04-17 VITALS — BP 120/70 | HR 90 | Ht 70.0 in | Wt 197.0 lb

## 2019-04-17 DIAGNOSIS — Z8616 Personal history of COVID-19: Secondary | ICD-10-CM

## 2019-04-17 DIAGNOSIS — Z0000 Encounter for general adult medical examination without abnormal findings: Secondary | ICD-10-CM | POA: Diagnosis not present

## 2019-04-17 DIAGNOSIS — E559 Vitamin D deficiency, unspecified: Secondary | ICD-10-CM | POA: Diagnosis not present

## 2019-04-17 DIAGNOSIS — R8290 Unspecified abnormal findings in urine: Secondary | ICD-10-CM

## 2019-04-17 DIAGNOSIS — E7800 Pure hypercholesterolemia, unspecified: Secondary | ICD-10-CM

## 2019-04-17 LAB — POCT URINALYSIS DIPSTICK
Bilirubin, UA: NEGATIVE
Blood, UA: NEGATIVE
Glucose, UA: NEGATIVE
Ketones, UA: NEGATIVE
Nitrite, UA: NEGATIVE
Protein, UA: NEGATIVE
Spec Grav, UA: 1.01 (ref 1.010–1.025)
Urobilinogen, UA: 0.2 E.U./dL
pH, UA: 6.5 (ref 5.0–8.0)

## 2019-04-17 MED ORDER — ERGOCALCIFEROL 1.25 MG (50000 UT) PO CAPS: 50000.0000 [IU] | ORAL_CAPSULE | ORAL | 1 refills | Status: DC

## 2019-04-17 NOTE — Progress Notes (Signed)
   Subjective:    Patient ID: Judy [name], female    DOB: [birth year], 39 y.o.   MRN: [id number]  HPI  39 year old Female for health maintenance exam and evaluation of medical issues.  Had Covid-19 in November and had to get IVF in ED but was not admitted. Has no residual effects.  History of right lower lobe pneumonia.  Labs show elevated LDL and Vitamin D deficiency.   History of gestational diabetes. Fasting glucose is normal.  Hx cholecystectomy 2012 abdominoplasty,rhinoplasty  QZR:AQTMAU healthy. Father deceased with hx of MI. One sister.   SHx: nonsmoker. No alcohol consumption.Homemaker  Review of Systems  Constitutional: Negative.   Respiratory: Negative.   Cardiovascular: Negative.   Gastrointestinal: Negative.   Genitourinary: Negative.   Neurological: Negative.   Psychiatric/Behavioral: Negative.        Objective:   Physical Exam Vitals reviewed.  Constitutional:      Appearance: Normal appearance.  HENT:     Head: Normocephalic and atraumatic.     Right Ear: Tympanic membrane normal.     Left Ear: Tympanic membrane normal.     Nose: Nose normal.  Eyes:     General:        Right eye: No discharge.        Left eye: No discharge.  Cardiovascular:     Rate and Rhythm: Normal rate.     Heart sounds: Normal heart sounds. No murmur.  Pulmonary:     Effort: Pulmonary effort is normal.     Breath sounds: Normal breath sounds.  Abdominal:     Palpations: Abdomen is soft.     Tenderness: There is no guarding or rebound.  Musculoskeletal:     Cervical back: Neck supple. No rigidity.     Right lower leg: No edema.     Left lower leg: No edema.  Skin:    General: Skin is warm and dry.  Neurological:     General: No focal deficit present.     Mental Status: She is alert.  Psychiatric:        Mood and Affect: Mood normal.        Behavior: Behavior normal.        Thought Content: Thought content normal.        Judgment: Judgment normal.    BMI  28.27,BP120/70, pulse 90       Assessment & Plan:  Elevated LDL cholesterol of 139  Vitamin D deficiency- level is 11  Recent Covid-19 infection  Abnormal urine dipstick but culture shows mixed flora consistent with contaminant  Plan: Encourage diet exercise and weight loss. Take vitamin D supplement weekly x 12 weeks then 4000 units daily. Watch diet and exercise. RTC one year

## 2019-04-17 NOTE — Patient Instructions (Signed)
It was a pleasure to see you today.  Take high-dose vitamin D weekly for 6 months.  Then purchase 4000 units vitamin D3 over the counter and take that daily.  Work on diet and exercise.  Follow-up with fasting lipid panel and vitamin D level in 6 months with office visit.

## 2019-04-18 LAB — URINE CULTURE
MICRO NUMBER:: 10076791
SPECIMEN QUALITY:: ADEQUATE

## 2019-04-18 MED ORDER — OFLOXACIN 0.3 % OP SOLN: 2.0000 [drp] | Freq: Four times a day (QID) | OPHTHALMIC | 0 refills | Status: DC

## 2019-06-30 ENCOUNTER — Other Ambulatory Visit: Payer: Self-pay

## 2019-06-30 MED ORDER — ALPRAZOLAM 0.25 MG PO TABS: 0.2500 mg | ORAL_TABLET | Freq: Two times a day (BID) | ORAL | 0 refills | Status: DC | PRN

## 2019-06-30 NOTE — Telephone Encounter (Signed)
Patient called to request a refill, last refill 12/09/2019 had CPE on 04/17/2019.

## 2019-08-21 IMAGING — MR MRI CERVICAL SPINE WITHOUT CONTRAST
5 series · 29 of 48 positions shown · non-contrast
Comparison: None.

CLINICAL DATA: Neck pain for 6 months.  No radicular symptoms.

EXAM:
MRI CERVICAL SPINE WITHOUT CONTRAST
TECHNIQUE: Multiplanar, multisequence MR imaging of the cervical spine was
performed. No intravenous contrast was administered.

[Series 3: tir sag · sagittal · 3.0mm · 0.41mm/px · 6 of 13 slices shown]
[im 1/13]
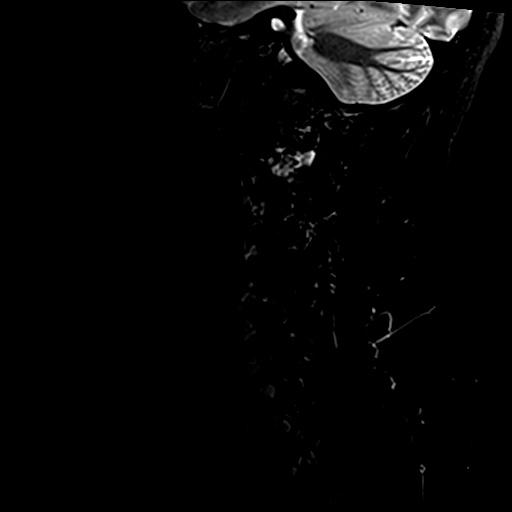
[im 3/13]
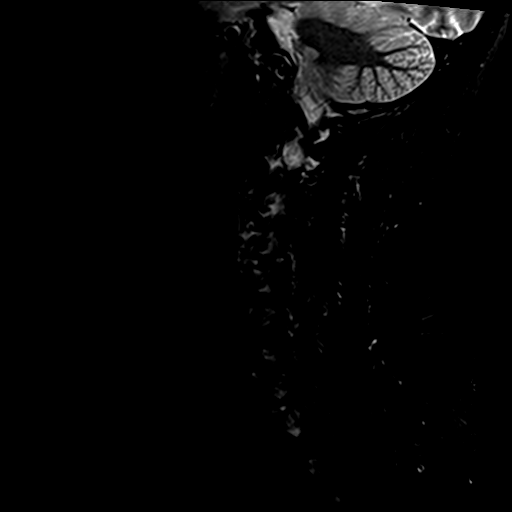
[im 5/13]
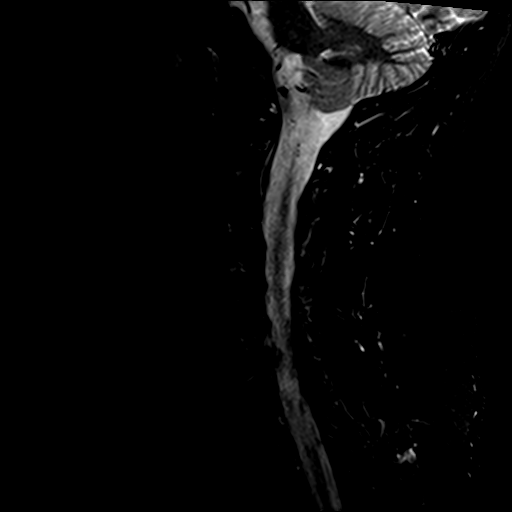
[im 8/13]
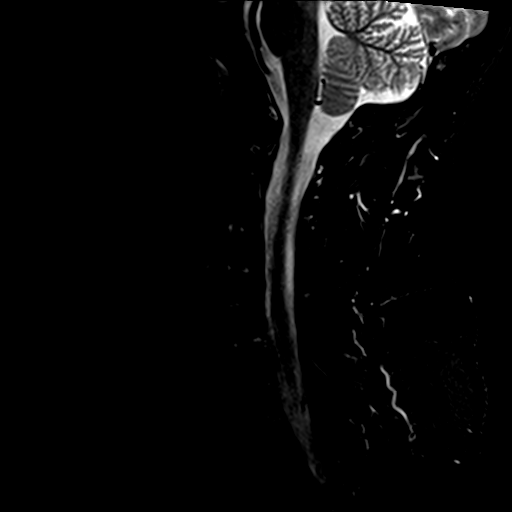
[im 10/13]
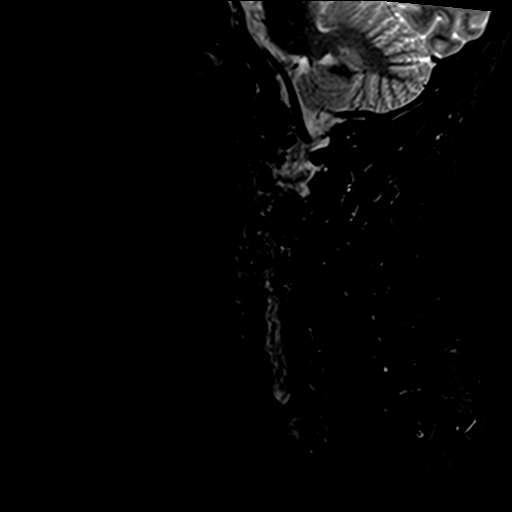
[im 13/13]
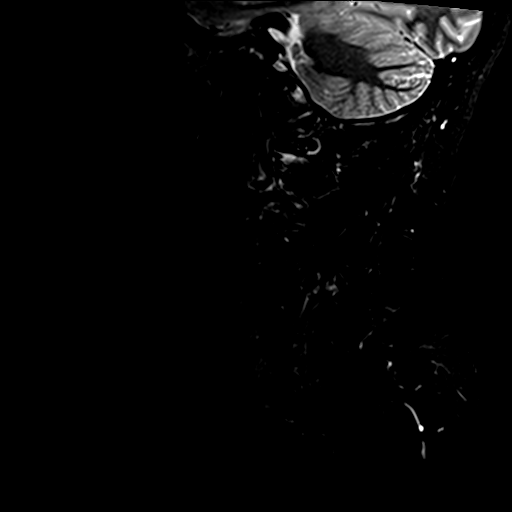

[Series 4: T1 · sagittal · 3.0mm · 0.41mm/px · 6 of 13 slices shown]
[im 1/13]
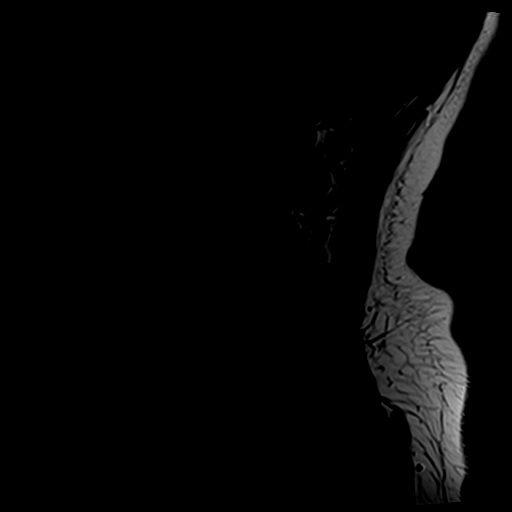
[im 3/13]
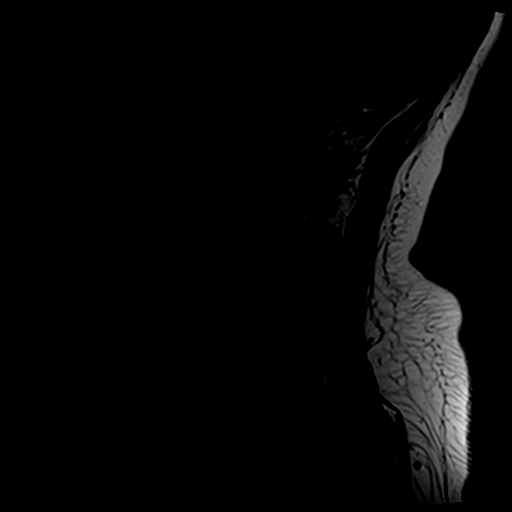
[im 5/13]
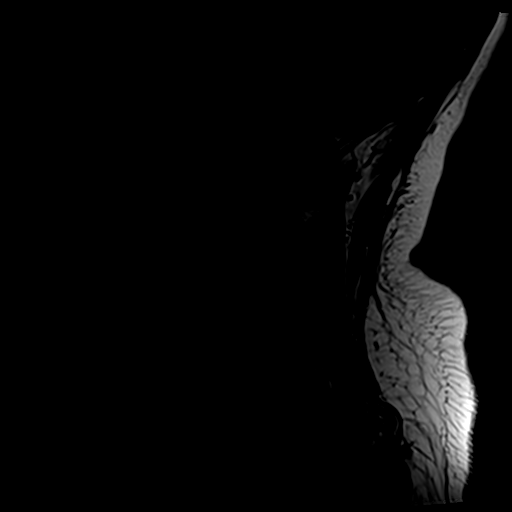
[im 8/13]
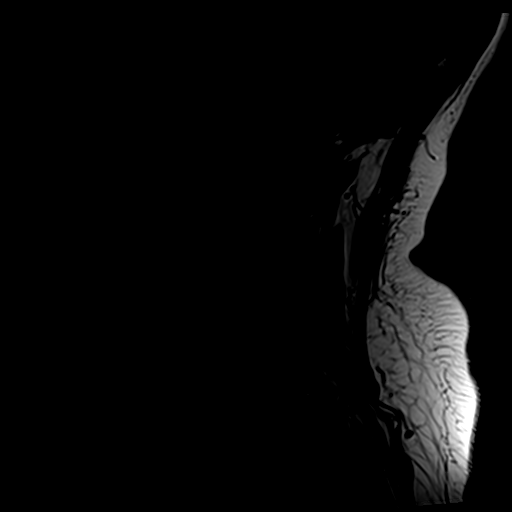
[im 10/13]
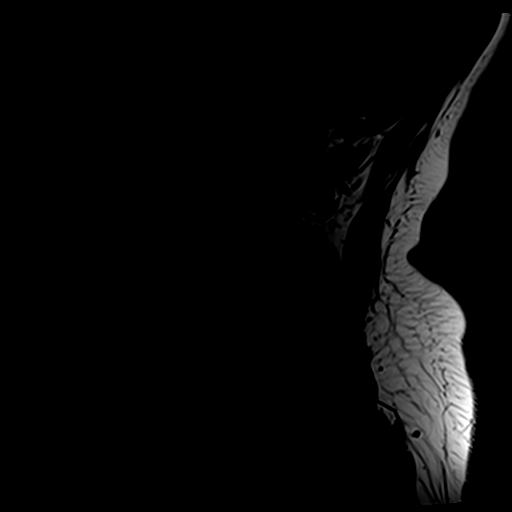
[im 13/13]
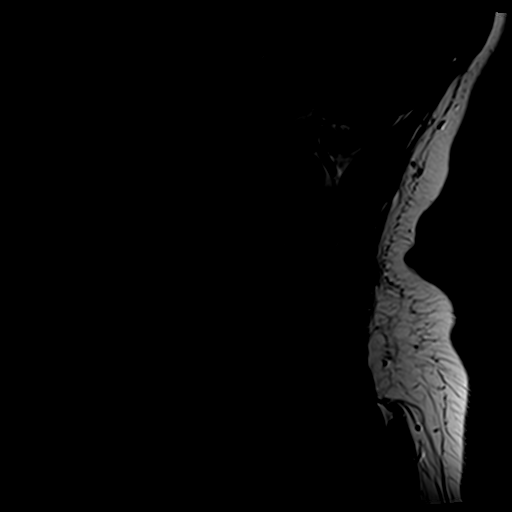

[Series 5: T2 · sagittal · 3.0mm · 0.66mm/px · 6 of 13 slices shown (1 of 2)]
[im 1/13]
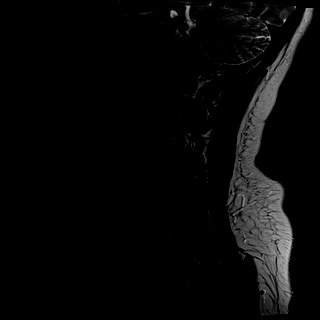
[im 3/13]
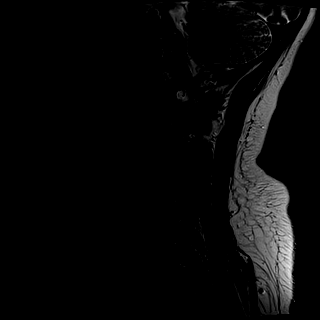
[im 5/13]
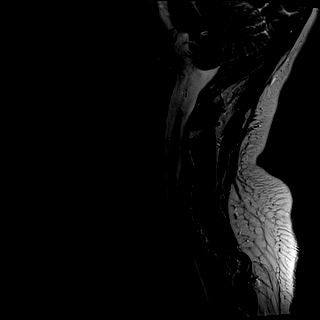
[im 8/13]
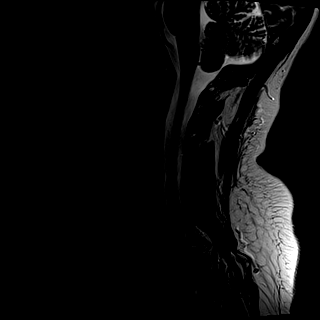
[im 10/13]
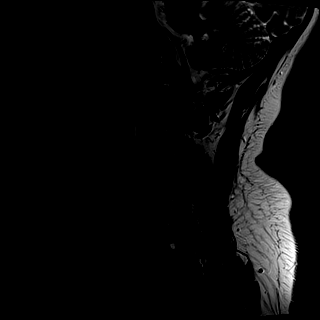
[im 13/13]
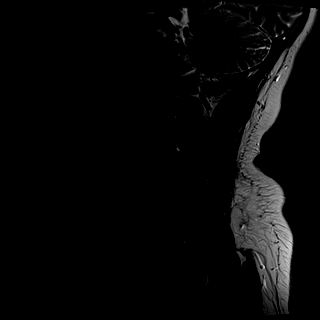

[Series 6: GRE · axial · 3.0mm · 0.35mm/px · z∈[-67,-52]mm · 2 of 30 slices shown]
[im 1/30]
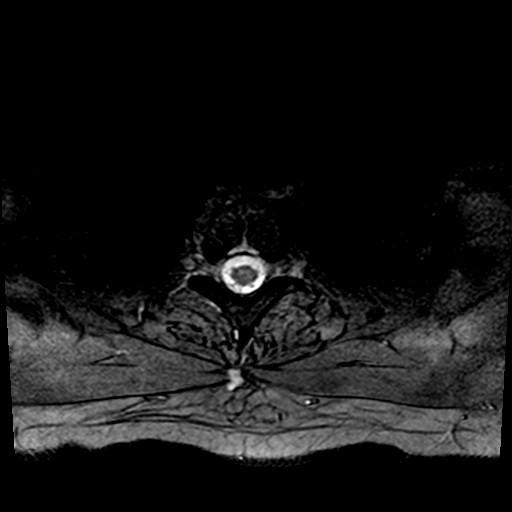
[im 5/30]
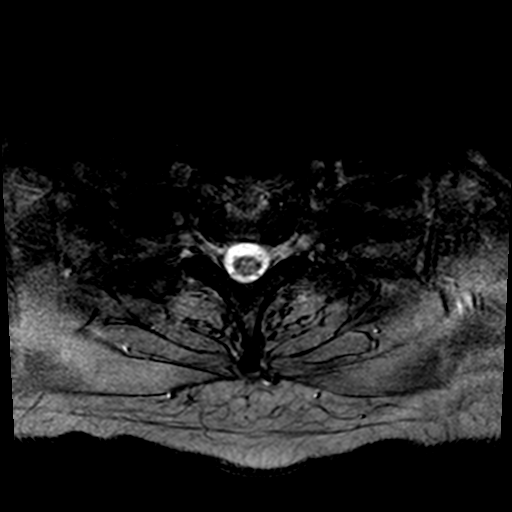

[Series 7: T2 · axial · 3.0mm · 0.70mm/px · z∈[-67,+41]mm · 9 of 30 slices shown (2 of 2)]
[im 1/30]
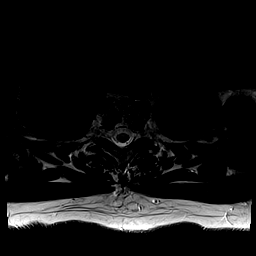
[im 5/30]
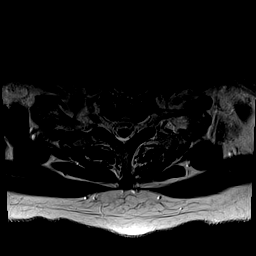
[im 9/30]
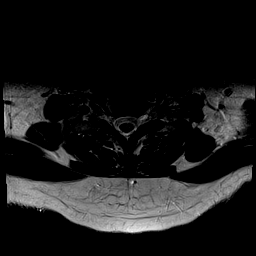
[im 13/30]
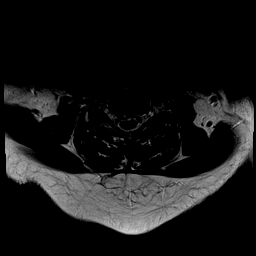
[im 15/30]
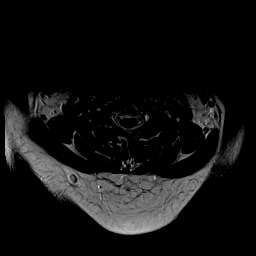
[im 17/30]
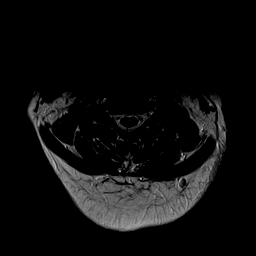
[im 21/30]
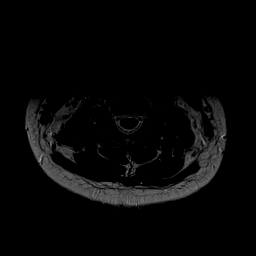
[im 25/30]
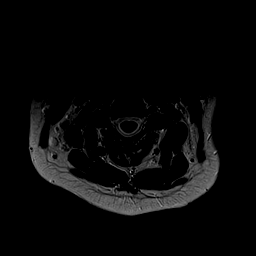
[im 30/30]
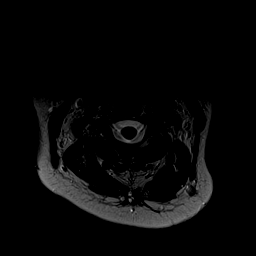

[29 of 48 positions shown; findings below may reference images not displayed]

FINDINGS: Alignment: Normal

Vertebrae: Normal marrow signal.  No bone lesions or fractures.

Cord: Normal cervical spinal cord.  No cord lesions or syrinx.

Posterior Fossa, vertebral arteries, paraspinal tissues: No
significant findings.

Disc levels:

C2-3: No significant findings.

C3-4: No significant findings.

C4-5: No significant findings.

C5-6: There is a shallow right foraminal disc protrusion without
significant neural compression but potential irritation of the right
C6 nerve root.

C6-7: There is a large right paracentral disc extrusion with disc
material coursing up behind the C6 vertebral body. There is also a
medial foraminal encroachment likely affecting the right C7 nerve
root.

C7-T1: No significant findings.
IMPRESSION: 1. Large right paracentral disc extrusion at C6-7 with a right
foraminal component also.
2. Shallow right foraminal disc protrusion at C5-6.

## 2019-09-07 ENCOUNTER — Other Ambulatory Visit: Payer: BC Managed Care – PPO | Admitting: Internal Medicine

## 2019-09-15 ENCOUNTER — Ambulatory Visit: Payer: BC Managed Care – PPO | Admitting: Internal Medicine

## 2019-11-13 DIAGNOSIS — H5201 Hypermetropia, right eye: Secondary | ICD-10-CM | POA: Diagnosis not present

## 2019-11-13 DIAGNOSIS — H04123 Dry eye syndrome of bilateral lacrimal glands: Secondary | ICD-10-CM | POA: Diagnosis not present

## 2021-04-17 DIAGNOSIS — H66002 Acute suppurative otitis media without spontaneous rupture of ear drum, left ear: Secondary | ICD-10-CM | POA: Diagnosis not present

## 2021-04-17 DIAGNOSIS — H6501 Acute serous otitis media, right ear: Secondary | ICD-10-CM | POA: Diagnosis not present

## 2021-10-11 DIAGNOSIS — J069 Acute upper respiratory infection, unspecified: Secondary | ICD-10-CM | POA: Diagnosis not present

## 2021-10-11 DIAGNOSIS — J029 Acute pharyngitis, unspecified: Secondary | ICD-10-CM | POA: Diagnosis not present

## 2021-12-10 DIAGNOSIS — M542 Cervicalgia: Secondary | ICD-10-CM | POA: Diagnosis not present

## 2021-12-15 DIAGNOSIS — M5412 Radiculopathy, cervical region: Secondary | ICD-10-CM | POA: Diagnosis not present

## 2021-12-31 DIAGNOSIS — M67911 Unspecified disorder of synovium and tendon, right shoulder: Secondary | ICD-10-CM | POA: Diagnosis not present

## 2022-01-07 ENCOUNTER — Other Ambulatory Visit: Payer: Self-pay | Admitting: Family Medicine

## 2022-01-07 DIAGNOSIS — M67911 Unspecified disorder of synovium and tendon, right shoulder: Secondary | ICD-10-CM

## 2022-01-07 DIAGNOSIS — M542 Cervicalgia: Secondary | ICD-10-CM

## 2022-01-23 ENCOUNTER — Encounter: Payer: Self-pay | Admitting: Family Medicine

## 2022-01-25 ENCOUNTER — Ambulatory Visit
Admission: RE | Admit: 2022-01-25 | Discharge: 2022-01-25 | Disposition: A | Discharge status: Home / Self Care | Payer: BC Managed Care – PPO | Source: Ambulatory Visit | Attending: Family Medicine | Admitting: Family Medicine

## 2022-01-25 DIAGNOSIS — M4802 Spinal stenosis, cervical region: Secondary | ICD-10-CM | POA: Diagnosis not present

## 2022-01-25 DIAGNOSIS — M542 Cervicalgia: Secondary | ICD-10-CM

## 2022-01-25 DIAGNOSIS — M67911 Unspecified disorder of synovium and tendon, right shoulder: Secondary | ICD-10-CM

## 2022-01-30 DIAGNOSIS — M5412 Radiculopathy, cervical region: Secondary | ICD-10-CM | POA: Diagnosis not present

## 2022-02-17 DIAGNOSIS — M5412 Radiculopathy, cervical region: Secondary | ICD-10-CM | POA: Diagnosis not present

## 2022-03-27 DIAGNOSIS — M5412 Radiculopathy, cervical region: Secondary | ICD-10-CM | POA: Diagnosis not present

## 2022-05-19 DIAGNOSIS — Z79899 Other long term (current) drug therapy: Secondary | ICD-10-CM | POA: Diagnosis not present

## 2022-05-19 DIAGNOSIS — Z79891 Long term (current) use of opiate analgesic: Secondary | ICD-10-CM | POA: Diagnosis not present

## 2022-05-19 DIAGNOSIS — Z5181 Encounter for therapeutic drug level monitoring: Secondary | ICD-10-CM | POA: Diagnosis not present

## 2022-06-11 DIAGNOSIS — Z6827 Body mass index (BMI) 27.0-27.9, adult: Secondary | ICD-10-CM | POA: Diagnosis not present

## 2022-06-11 DIAGNOSIS — Z1231 Encounter for screening mammogram for malignant neoplasm of breast: Secondary | ICD-10-CM | POA: Diagnosis not present

## 2022-06-11 DIAGNOSIS — Z01419 Encounter for gynecological examination (general) (routine) without abnormal findings: Secondary | ICD-10-CM | POA: Diagnosis not present

## 2022-08-28 DIAGNOSIS — M5412 Radiculopathy, cervical region: Secondary | ICD-10-CM | POA: Diagnosis not present

## 2022-09-16 DIAGNOSIS — M5412 Radiculopathy, cervical region: Secondary | ICD-10-CM | POA: Diagnosis not present

## 2022-10-06 DIAGNOSIS — M542 Cervicalgia: Secondary | ICD-10-CM | POA: Diagnosis not present

## 2022-10-10 DIAGNOSIS — M542 Cervicalgia: Secondary | ICD-10-CM | POA: Diagnosis not present

## 2022-10-22 DIAGNOSIS — M5412 Radiculopathy, cervical region: Secondary | ICD-10-CM | POA: Diagnosis not present

## 2022-10-27 DIAGNOSIS — M542 Cervicalgia: Secondary | ICD-10-CM | POA: Diagnosis not present

## 2022-11-11 DIAGNOSIS — M5412 Radiculopathy, cervical region: Secondary | ICD-10-CM | POA: Diagnosis not present

## 2022-11-17 DIAGNOSIS — M546 Pain in thoracic spine: Secondary | ICD-10-CM | POA: Diagnosis not present

## 2022-11-22 DIAGNOSIS — J019 Acute sinusitis, unspecified: Secondary | ICD-10-CM | POA: Diagnosis not present

## 2022-11-22 DIAGNOSIS — Z681 Body mass index (BMI) 19 or less, adult: Secondary | ICD-10-CM | POA: Diagnosis not present

## 2022-12-01 DIAGNOSIS — M546 Pain in thoracic spine: Secondary | ICD-10-CM | POA: Diagnosis not present

## 2022-12-01 DIAGNOSIS — R531 Weakness: Secondary | ICD-10-CM | POA: Diagnosis not present

## 2022-12-08 DIAGNOSIS — M546 Pain in thoracic spine: Secondary | ICD-10-CM | POA: Diagnosis not present

## 2022-12-08 DIAGNOSIS — R531 Weakness: Secondary | ICD-10-CM | POA: Diagnosis not present

## 2022-12-14 DIAGNOSIS — M546 Pain in thoracic spine: Secondary | ICD-10-CM | POA: Diagnosis not present

## 2022-12-14 DIAGNOSIS — R531 Weakness: Secondary | ICD-10-CM | POA: Diagnosis not present

## 2022-12-21 DIAGNOSIS — R531 Weakness: Secondary | ICD-10-CM | POA: Diagnosis not present

## 2022-12-21 DIAGNOSIS — M546 Pain in thoracic spine: Secondary | ICD-10-CM | POA: Diagnosis not present

## 2022-12-28 DIAGNOSIS — M546 Pain in thoracic spine: Secondary | ICD-10-CM | POA: Diagnosis not present

## 2022-12-28 DIAGNOSIS — R531 Weakness: Secondary | ICD-10-CM | POA: Diagnosis not present

## 2023-01-12 DIAGNOSIS — R531 Weakness: Secondary | ICD-10-CM | POA: Diagnosis not present

## 2023-01-12 DIAGNOSIS — M546 Pain in thoracic spine: Secondary | ICD-10-CM | POA: Diagnosis not present

## 2023-01-18 DIAGNOSIS — M546 Pain in thoracic spine: Secondary | ICD-10-CM | POA: Diagnosis not present

## 2023-01-18 DIAGNOSIS — R531 Weakness: Secondary | ICD-10-CM | POA: Diagnosis not present

## 2023-02-01 DIAGNOSIS — R531 Weakness: Secondary | ICD-10-CM | POA: Diagnosis not present

## 2023-02-01 DIAGNOSIS — M546 Pain in thoracic spine: Secondary | ICD-10-CM | POA: Diagnosis not present

## 2023-02-08 DIAGNOSIS — R531 Weakness: Secondary | ICD-10-CM | POA: Diagnosis not present

## 2023-02-08 DIAGNOSIS — M546 Pain in thoracic spine: Secondary | ICD-10-CM | POA: Diagnosis not present

## 2023-02-22 DIAGNOSIS — R531 Weakness: Secondary | ICD-10-CM | POA: Diagnosis not present

## 2023-02-22 DIAGNOSIS — M546 Pain in thoracic spine: Secondary | ICD-10-CM | POA: Diagnosis not present

## 2023-03-01 DIAGNOSIS — M546 Pain in thoracic spine: Secondary | ICD-10-CM | POA: Diagnosis not present

## 2023-03-01 DIAGNOSIS — R531 Weakness: Secondary | ICD-10-CM | POA: Diagnosis not present

## 2023-03-08 DIAGNOSIS — R531 Weakness: Secondary | ICD-10-CM | POA: Diagnosis not present

## 2023-03-08 DIAGNOSIS — M546 Pain in thoracic spine: Secondary | ICD-10-CM | POA: Diagnosis not present

## 2023-06-01 DIAGNOSIS — R531 Weakness: Secondary | ICD-10-CM | POA: Diagnosis not present

## 2023-06-01 DIAGNOSIS — M546 Pain in thoracic spine: Secondary | ICD-10-CM | POA: Diagnosis not present

## 2023-06-01 DIAGNOSIS — M25561 Pain in right knee: Secondary | ICD-10-CM | POA: Diagnosis not present

## 2023-06-07 DIAGNOSIS — M546 Pain in thoracic spine: Secondary | ICD-10-CM | POA: Diagnosis not present

## 2023-06-07 DIAGNOSIS — R531 Weakness: Secondary | ICD-10-CM | POA: Diagnosis not present

## 2023-06-08 DIAGNOSIS — J029 Acute pharyngitis, unspecified: Secondary | ICD-10-CM | POA: Diagnosis not present

## 2023-06-15 ENCOUNTER — Ambulatory Visit: Payer: Self-pay

## 2023-06-15 NOTE — Telephone Encounter (Signed)
 LVM to CB and schedule an appointment for Anxiety, morning appointment, and schedule a physical with labs prior for late May or early June. She will take her back as patient

## 2023-06-15 NOTE — Telephone Encounter (Signed)
 Chief Complaint: anxiety Symptoms: anxiety with heart racing at times Frequency: x 1 week Pertinent Negatives: Patient denies chest pain, sob Disposition: [] ED /[] Urgent Care (no appt availability in office) / [x] Appointment(In office/virtual)/ []  [facility]/ [] Home Care/ [x] Refused Recommended Disposition /[x] Judy [name]/ []  Follow-up with PCP Additional Notes: Patient called in stating that she has been having some increase in anxiety for the past week. She has decreased her caffeine intake to maybe one cup a day. States that she used to see Dr. Lenord [name] and was given Xanax she thinks but that was years ago. Instructed patient that Dr. Lenord [name] is not taking new patients and offered to schedule appt with another provider but patient declined and wanted to wait to her from Dr. Lenord [name], [facility] and spoke with Judy [name] CMA at the office and she confirmed but will check with Dr. Lenord [name]. Instructed patient on [name] as well and UC for symptoms until she can get established. Denies thoughts of self harm or suicide.   Copied from CRM [id number]. Topic: Clinical - Red Word Triage >> Jun 15, 2023 11:31 AM [name] wrote: [facility] that prompted transfer to Nurse Triage: a lot of anxiety Reason for Disposition  [1] Symptoms of anxiety or panic attack AND [2] is a chronic symptom (recurrent or ongoing AND present > 4 weeks)  Answer Assessment - Initial Assessment Questions 1. CONCERN: "Did anything happen that prompted you to call today?"      Not really 2. ANXIETY SYMPTOMS: "Can you describe how you (your loved one; patient) have been feeling?" (e.g., tense, restless, panicky, anxious, keyed up, overwhelmed, sense of impending doom).      Anxious and tensed 3. ONSET: "How long have you been feeling this way?" (e.g., hours, days, weeks)     A weeks 4. SEVERITY: "How would you rate the level of anxiety?" (e.g., 0 - 10; or mild, moderate, severe).    Mild -mod 5. FUNCTIONAL  IMPAIRMENT: "How have these feelings affected your ability to do daily activities?" "Have you had more difficulty than usual doing your normal daily activities?" (e.g., getting better, same, worse; self-care, school, work, interactions)     no 6. HISTORY: "Have you felt this way before?" "Have you ever been diagnosed with an anxiety problem in the past?" (e.g., generalized anxiety disorder, panic attacks, PTSD). If Yes, ask: "How was this problem treated?" (e.g., medicines, counseling, etc.)     Yes years ago xanax possibly 7. RISK OF HARM - SUICIDAL IDEATION: "Do you ever have thoughts of hurting or killing yourself?" If Yes, ask:  "Do you have these feelings now?" "Do you have a plan on how you would do this?"     no 8. TREATMENT:  "What has been done so far to treat this anxiety?" (e.g., medicines, relaxation strategies). "What has helped?"     Decrease caffeine 9. TREATMENT - THERAPIST: "Do you have a counselor or therapist? Name?"     no 10. POTENTIAL TRIGGERS: "Do you drink caffeinated beverages (e.g., coffee, colas, teas), and how much daily?" "Do you drink alcohol or use any drugs?" "Have you started any new medicines recently?"       Caffeine only 1 iced coffee 11. PATIENT SUPPORT: "Who is with you now?" "Who do you live with?" "Do you have family or friends who you can talk to?"        yes 12. OTHER SYMPTOMS: "Do you have any other symptoms?" (e.g., feeling depressed, trouble concentrating, trouble sleeping, trouble  breathing, palpitations or fast heartbeat, chest pain, sweating, nausea, or diarrhea)       Nausea and heart racing sometimes  Protocols used: Anxiety and Panic Attack-A-AH

## 2023-06-17 ENCOUNTER — Encounter: Payer: Self-pay | Admitting: Internal Medicine

## 2023-06-17 ENCOUNTER — Ambulatory Visit (INDEPENDENT_AMBULATORY_CARE_PROVIDER_SITE_OTHER): Admitting: Internal Medicine

## 2023-06-17 VITALS — BP 128/82 | HR 94 | Temp 98.3°F | Resp 12 | Ht 70.0 in | Wt 200.0 lb

## 2023-06-17 DIAGNOSIS — F411 Generalized anxiety disorder: Secondary | ICD-10-CM

## 2023-06-17 DIAGNOSIS — F419 Anxiety disorder, unspecified: Secondary | ICD-10-CM | POA: Insufficient documentation

## 2023-06-17 MED ORDER — ALPRAZOLAM 0.5 MG PO TABS: ORAL_TABLET | ORAL | 0 refills | Status: AC

## 2023-06-17 MED ORDER — FLUOXETINE HCL 20 MG PO TABS: 20.0000 mg | ORAL_TABLET | Freq: Every day | ORAL | 0 refills | Status: DC

## 2023-06-17 NOTE — Telephone Encounter (Signed)
 Scheduled an appointment.

## 2023-06-17 NOTE — Progress Notes (Signed)
 Patient Care Team: Margaree Mackintosh, MD as PCP - General (Internal Medicine)  Visit Date: 06/17/23  Subjective:   Chief Complaint  Patient presents with   Anxiety  [name],Female DOB:1980-08-23,43 y.o. UJW:[id number]   43 y.o. Female presents today for acute visit with Anxiety. Patient has a family history of Anxiety in son, who takes Prozac. She has in the past taken Zoloft, but according to her, she did not like how it made her feel. She says that she is not sure why she is anxious as there is nothing in her life right now that would seemingly contribute to anxiety. Past Medical History:  Diagnosis Date   Constipation    Diabetes mellitus without complication (HCC)    Epigastric pain    Gallstones    Recently diagnosed by Dr. Jarold Motto.   Gestational diabetes    Headache(784.0)    Hx of varicella    Hyperlipidemia    Rash    Bruises easily, per medical history form dated 09/15/10  No Known Allergies  Family History  Problem Relation Age of Onset   Diabetes Maternal Grandfather    Heart disease Father    Heart attack Father    Fibromyalgia Maternal Aunt    Diabetes Maternal Aunt    Social Hx: Married. She says things are going well except for anxiety that is noticeable upon awakening.  Review of Systems  Psychiatric/Behavioral:  The patient is nervous/anxious (unknown trigger).   All other systems reviewed and are negative.    Objective:  Vitals: BP 128/82 (BP Location: Left Arm, Patient Position: Sitting)   Pulse 94   Temp 98.3 F (36.8 C) (Temporal)   Resp 12   Ht 5\' 10"  (1.778 m)   Wt 200 lb (90.7 kg)   LMP 05/21/2023   SpO2 97%   BMI 28.70 kg/m   Physical Exam Vitals and nursing note reviewed.  Constitutional:      General: She is not in acute distress.    Appearance: Normal appearance. She is not toxic-appearing.  HENT:     Head: Normocephalic and atraumatic.  Pulmonary:     Effort: Pulmonary effort is normal.  Skin:    General:  Skin is warm and dry.  Neurological:     Mental Status: She is alert and oriented to person, place, and time. Mental status is at baseline.  Psychiatric:        Mood and Affect: Mood normal.        Behavior: Behavior normal.        Thought Content: Thought content normal.        Judgment: Judgment normal.     Results:  Studies Obtained And Personally Reviewed By Me: Labs:     Component Value Date/Time   NA 138 04/14/2019 0943   K 4.4 04/14/2019 0943   CL 106 04/14/2019 0943   CO2 24 04/14/2019 0943   GLUCOSE 97 04/14/2019 0943   BUN 16 04/14/2019 0943   CREATININE 0.80 04/14/2019 0943   CALCIUM 9.6 04/14/2019 0943   PROT 7.1 04/14/2019 0943   ALBUMIN 4.0 02/20/2019 1223   AST 15 04/14/2019 0943   ALT 29 04/14/2019 0943   ALKPHOS 46 02/20/2019 1223   BILITOT 0.4 04/14/2019 0943   GFRNONAA 94 04/14/2019 0943   GFRAA 109 04/14/2019 0943    Lab Results  Component Value Date   WBC 7.3 04/14/2019   HGB 14.5 04/14/2019   HCT 42.6 04/14/2019   MCV 86.6 04/14/2019  PLT 249 04/14/2019   Lab Results  Component Value Date   CHOL 210 (H) 04/14/2019   HDL 57 04/14/2019   LDLCALC 139 (H) 04/14/2019   TRIG 58 04/14/2019   CHOLHDL 3.7 04/14/2019   Lab Results  Component Value Date   TSH 0.80 04/14/2019   Assessment & Plan:  Anxiety:  In the past she taken Zoloft, but did not like how it made her feel. She says that she is not sure why she is anxious as there is nothing in her life right now that would seemingly contribute to anxiety. Referred to Ellis Savage at [facility] and sending in Xanax 0.5 mg to take as needed up to 1 mg twice daily, as well as Prozac 20 mg daily, which is what her son takes so  hopefully may work well for her. Suggest counseling to help with anxiety as well.  Has positive depression screen today but she says she is anxious rather than depressed. I think counseling would be of benefit.  Plan to follow up in 4 weeks.  30 minutes  spent with patient today.  I,Emily Lagle,acting as a Neurosurgeon for Margaree Mackintosh, MD.,have documented all relevant documentation on the behalf of Margaree Mackintosh, MD,as directed by  Margaree Mackintosh, MD while in the presence of Margaree Mackintosh, MD.   I, Margaree Mackintosh, MD, have reviewed all documentation for this visit. The documentation on 06/17/23 for the exam, diagnosis, procedures, and orders are all accurate and complete.

## 2023-06-17 NOTE — Patient Instructions (Signed)
 Begin Prozac 20 mg daily and Xanax 0.5 mg twice daily for anxiety. Symptoms are c/w anxiety and perhaps some underlying depression. I think counseling could be of benefit. Have provided patient with counseling resource. Follow up in 4 weeks.

## 2023-06-21 ENCOUNTER — Other Ambulatory Visit

## 2023-06-21 DIAGNOSIS — Z0000 Encounter for general adult medical examination without abnormal findings: Secondary | ICD-10-CM | POA: Diagnosis not present

## 2023-06-21 DIAGNOSIS — E559 Vitamin D deficiency, unspecified: Secondary | ICD-10-CM | POA: Diagnosis not present

## 2023-06-21 DIAGNOSIS — M546 Pain in thoracic spine: Secondary | ICD-10-CM | POA: Diagnosis not present

## 2023-06-21 DIAGNOSIS — R531 Weakness: Secondary | ICD-10-CM | POA: Diagnosis not present

## 2023-06-22 LAB — CBC WITH DIFFERENTIAL/PLATELET
Absolute Lymphocytes: 2158 {cells}/uL (ref 850–3900)
Absolute Monocytes: 336 {cells}/uL (ref 200–950)
Basophils Absolute: 41 {cells}/uL (ref 0–200)
Basophils Relative: 0.7 %
Eosinophils Absolute: 87 {cells}/uL (ref 15–500)
Eosinophils Relative: 1.5 %
HCT: 43.9 % (ref 35.0–45.0)
Hemoglobin: 14.8 g/dL (ref 11.7–15.5)
MCH: 29.1 pg (ref 27.0–33.0)
MCHC: 33.7 g/dL (ref 32.0–36.0)
MCV: 86.2 fL (ref 80.0–100.0)
MPV: 10.2 fL (ref 7.5–12.5)
Monocytes Relative: 5.8 %
Neutro Abs: 3178 {cells}/uL (ref 1500–7800)
Neutrophils Relative %: 54.8 %
Platelets: 270 10*3/uL (ref 140–400)
RBC: 5.09 10*6/uL (ref 3.80–5.10)
RDW: 12.7 % (ref 11.0–15.0)
Total Lymphocyte: 37.2 %
WBC: 5.8 10*3/uL (ref 3.8–10.8)

## 2023-06-22 LAB — LIPID PANEL
Cholesterol: 244 mg/dL — ABNORMAL HIGH (ref <200)
HDL: 53 mg/dL (ref >=50)
LDL Cholesterol (Calc): 167 mg/dL — ABNORMAL HIGH
Non-HDL Cholesterol (Calc): 191 mg/dL — ABNORMAL HIGH (ref <130)
Total CHOL/HDL Ratio: 4.6 (calc) (ref <5.0)
Triglycerides: 109 mg/dL (ref <150)

## 2023-06-22 LAB — COMPLETE METABOLIC PANEL WITHOUT GFR
AG Ratio: 1.8 (calc) (ref 1.0–2.5)
ALT: 14 U/L (ref 6–29)
AST: 13 U/L (ref 10–30)
Albumin: 4.7 g/dL (ref 3.6–5.1)
Alkaline phosphatase (APISO): 45 U/L (ref 31–125)
BUN: 12 mg/dL (ref 7–25)
CO2: 26 mmol/L (ref 20–32)
Calcium: 9.5 mg/dL (ref 8.6–10.2)
Chloride: 105 mmol/L (ref 98–110)
Creat: 0.67 mg/dL (ref 0.50–0.99)
Globulin: 2.6 g/dL (ref 1.9–3.7)
Glucose, Bld: 105 mg/dL — ABNORMAL HIGH (ref 65–99)
Potassium: 4 mmol/L (ref 3.5–5.3)
Sodium: 140 mmol/L (ref 135–146)
Total Bilirubin: 0.4 mg/dL (ref 0.2–1.2)
Total Protein: 7.3 g/dL (ref 6.1–8.1)

## 2023-06-22 LAB — TSH: TSH: 0.88 m[IU]/L

## 2023-06-22 LAB — VITAMIN D 25 HYDROXY (VIT D DEFICIENCY, FRACTURES): Vit D, 25-Hydroxy: 23 ng/mL — ABNORMAL LOW (ref 30–100)

## 2023-06-25 NOTE — Progress Notes (Signed)
 Called patient and schedule appointment to discuss labs - specifically her elevated cholesterol, glucose- she  will need HGB AIC drawn at visit, and also discuss Vitamin D deficiency.

## 2023-06-28 ENCOUNTER — Ambulatory Visit (INDEPENDENT_AMBULATORY_CARE_PROVIDER_SITE_OTHER): Admitting: Internal Medicine

## 2023-06-28 VITALS — BP 114/78 | HR 64 | Temp 97.7°F | Ht 70.0 in | Wt 197.0 lb

## 2023-06-28 DIAGNOSIS — E7800 Pure hypercholesterolemia, unspecified: Secondary | ICD-10-CM | POA: Diagnosis not present

## 2023-06-28 DIAGNOSIS — E559 Vitamin D deficiency, unspecified: Secondary | ICD-10-CM

## 2023-06-28 DIAGNOSIS — R531 Weakness: Secondary | ICD-10-CM | POA: Diagnosis not present

## 2023-06-28 DIAGNOSIS — F419 Anxiety disorder, unspecified: Secondary | ICD-10-CM | POA: Diagnosis not present

## 2023-06-28 DIAGNOSIS — E119 Type 2 diabetes mellitus without complications: Secondary | ICD-10-CM

## 2023-06-28 DIAGNOSIS — M546 Pain in thoracic spine: Secondary | ICD-10-CM | POA: Diagnosis not present

## 2023-06-28 DIAGNOSIS — Z8249 Family history of ischemic heart disease and other diseases of the circulatory system: Secondary | ICD-10-CM

## 2023-06-28 MED ORDER — ROSUVASTATIN CALCIUM 5 MG PO TABS: 5.0000 mg | ORAL_TABLET | Freq: Every day | ORAL | 3 refills | Status: AC

## 2023-06-28 MED ORDER — ERGOCALCIFEROL 1.25 MG (50000 UT) PO CAPS: 50000.0000 [IU] | ORAL_CAPSULE | ORAL | 3 refills | Status: AC

## 2023-06-28 NOTE — Progress Notes (Signed)
 Patient Care Team: Margaree Mackintosh, MD as PCP - General (Internal Medicine)  Visit Date: 06/28/23  Subjective:   Chief Complaint  Patient presents with   Medical Management of Chronic Issues  Patient WU:JWJXBJYN Colleran,Female DOB:08-03-1980,43 y.o. WGN:[id number]   43 y.o. Female presents today for acute visit to discuss her recent labs showing Vitamin-D Deficiency; Pure Hypercholesterolemia; and Elevated Blood Glucose. Patient has a past medical history of Gestational Diabetes; DM w/o Complication; Hyperlipidemia. Seen in office 3/27 for Anxiety, was started on Prozac 20 mg daily and Xanax 0.25 - 0.5 mg for this and today she says that even though it's only been a week since starting these she has been feeling better since starting them. Since it had been 3 years since she last had lab work, labs were drawn that day and today she presents to discuss these results.    Labs 06/21/2023: Vitamin-D, compared to 2021: 23, elevated from 11 but still low. Discussed starting Vitamin-D weekly.  CBC: WNL  CMP, compared to 2021: Blood Glucose 105, elevated from 97. Hx of Gestational Diabetes. HgbA1c drawn today, Microalbumin/Creatine specimen collected. Says her last eye exam was 11/20/2022 at [facility].  Lipid Panel, compared to 2021: Cholesterol 244, elevated from 210; LDL 167, elevated from 139. Hx of Hyperlipidemia. Discussed starting low-dose Rosuvastatin and having CT Cardiac Scoring done.   TSH: WNL at 0.88  Health Maintenance: Discussed Covid-19 vaccination (postponed), Hepatitis C Screening (postponed), and Cervical Cancer Screening (says she last had a PAP on 06/11/2022 with Zelphia Cairo). Past Medical History:  Diagnosis Date   Constipation    Diabetes mellitus without complication (HCC)    Epigastric pain    Gallstones    Recently diagnosed by Dr. Jarold Motto.   Gestational diabetes    Headache(784.0)    Hx of varicella    Hyperlipidemia    Rash    Bruises easily, per medical  history form dated 09/15/10  No Known Allergies  Family History  Problem Relation Age of Onset   Diabetes Maternal Grandfather    Heart disease Father    Heart attack Father    Fibromyalgia Maternal Aunt    Diabetes Maternal Aunt   Father deceased in his late 60s  Social History   Social History Narrative   Not on file   Review of Systems  Constitutional:  Negative for fever and malaise/fatigue.  HENT:  Negative for congestion.   Eyes:  Negative for blurred vision.  Respiratory:  Negative for cough and shortness of breath.   Cardiovascular:  Negative for chest pain, palpitations and leg swelling.  Gastrointestinal:  Negative for vomiting.  Musculoskeletal:  Negative for back pain.  Skin:  Negative for rash.  Neurological:  Negative for loss of consciousness and headaches.     Objective:  Vitals: BP 114/78   Pulse 64   Temp 97.7 F (36.5 C)   Ht 5\' 10"  (1.778 m)   Wt 197 lb (89.4 kg)   LMP 05/21/2023   SpO2 95%   BMI 28.27 kg/m   Physical Exam Vitals and nursing note reviewed.  Constitutional:      General: She is not in acute distress.    Appearance: Normal appearance. She is not toxic-appearing.  HENT:     Head: Normocephalic and atraumatic.  Cardiovascular:     Pulses:          Dorsalis pedis pulses are 2+ on the right side and 2+ on the left side.       Posterior  tibial pulses are 2+ on the right side and 2+ on the left side.  Pulmonary:     Effort: Pulmonary effort is normal.  Musculoskeletal:     Right foot: No deformity.     Left foot: No deformity.  Feet:     Right foot:     Skin integrity: Skin integrity normal. No ulcer, blister, skin breakdown or erythema.     Left foot:     Skin integrity: Skin integrity normal. No ulcer, blister, skin breakdown or erythema.  Skin:    General: Skin is warm and dry.  Neurological:     Mental Status: She is alert and oriented to person, place, and time. Mental status is at baseline.  Psychiatric:        Mood and  Affect: Mood normal.        Behavior: Behavior normal.        Thought Content: Thought content normal.        Judgment: Judgment normal.     Results:  Studies Obtained And Personally Reviewed By Me:  Diabetic Foot Exam - Simple   Simple Foot Form Diabetic Foot exam was performed with the following findings: Yes 06/28/2023 11:18 AM  Visual Inspection No deformities, no ulcerations, no other skin breakdown bilaterally: Yes Sensation Testing Intact to touch and monofilament testing bilaterally: Yes Pulse Check Posterior Tibialis and Dorsalis pulse intact bilaterally: Yes Comments    Labs:     Component Value Date/Time   NA 140 06/21/2023 1114   K 4.0 06/21/2023 1114   CL 105 06/21/2023 1114   CO2 26 06/21/2023 1114   GLUCOSE 105 (H) 06/21/2023 1114   BUN 12 06/21/2023 1114   CREATININE 0.67 06/21/2023 1114   CALCIUM 9.5 06/21/2023 1114   PROT 7.3 06/21/2023 1114   ALBUMIN 4.0 02/20/2019 1223   AST 13 06/21/2023 1114   ALT 14 06/21/2023 1114   ALKPHOS 46 02/20/2019 1223   BILITOT 0.4 06/21/2023 1114   GFRNONAA 94 04/14/2019 0943   GFRAA 109 04/14/2019 0943    Lab Results  Component Value Date   WBC 5.8 06/21/2023   HGB 14.8 06/21/2023   HCT 43.9 06/21/2023   MCV 86.2 06/21/2023   PLT 270 06/21/2023   Lab Results  Component Value Date   CHOL 244 (H) 06/21/2023   HDL 53 06/21/2023   LDLCALC 167 (H) 06/21/2023   TRIG 109 06/21/2023   CHOLHDL 4.6 06/21/2023   Lab Results  Component Value Date   TSH 0.88 06/21/2023    Assessment & Plan:  Seen in office 3/27 for Anxiety, was started on Prozac 20 mg daily and Xanax 0.25 - 0.5 mg for this and today she says that even though it's only been a week since starting these she has been feeling better.    Vitamin-D Deficiency from labs 06/21/2023, compared to 2021: 23, elevated from 11 but still low. Discussed starting Vitamin-D weekly - she is agreeable, sending in Vitamin-D 50,000 units to take weekly.  Elevated Blood  Glucose of 105 on 06/21/2023, elevated from 97 in 2021. Hx of Gestational Diabetes. HgbA1c drawn today, Microalbumin/Creatine specimen collected. UTD on eye exam: 11/20/2022 at [facility].  Pure Hypercholesterolemia with 06/21/2023 Lipid Panel, compared to 2021: Cholesterol 244, elevated from 210; LDL 167, elevated from 139. Hx of Hyperlipidemia. Discussed starting low-dose Rosuvastatin and having CT Cardiac Scoring done. Sent in Rosuvastatin 5 mg to take daily and ordered CT Cardiac Scoring.  Health Maintenance: Discussed Covid-19 vaccination (postponed), Hepatitis C  Screening (postponed), and Cervical Cancer Screening (says she last had a PAP on 06/11/2022 with Zelphia Cairo).  Other labs 06/21/2023 reviewed: CBC: WNL TSH: WNL at 0.88   I,Emily Lagle,acting as a scribe for Margaree Mackintosh, MD.,have documented all relevant documentation on the behalf of Margaree Mackintosh, MD,as directed by  Margaree Mackintosh, MD while in the presence of Margaree Mackintosh, MD.   ***

## 2023-06-29 DIAGNOSIS — M25561 Pain in right knee: Secondary | ICD-10-CM | POA: Diagnosis not present

## 2023-06-29 DIAGNOSIS — S83241A Other tear of medial meniscus, current injury, right knee, initial encounter: Secondary | ICD-10-CM | POA: Diagnosis not present

## 2023-06-29 LAB — HEMOGLOBIN A1C
Hgb A1c MFr Bld: 5.7 %{Hb} — ABNORMAL HIGH (ref <5.7)
Mean Plasma Glucose: 117 mg/dL
eAG (mmol/L): 6.5 mmol/L

## 2023-06-29 LAB — MICROALBUMIN / CREATININE URINE RATIO
Creatinine, Urine: 53 mg/dL (ref 20–275)
Microalb, Ur: <0.2 mg/dL

## 2023-06-30 ENCOUNTER — Encounter: Payer: Self-pay | Admitting: Internal Medicine

## 2023-06-30 DIAGNOSIS — E559 Vitamin D deficiency, unspecified: Secondary | ICD-10-CM | POA: Insufficient documentation

## 2023-06-30 DIAGNOSIS — E7800 Pure hypercholesterolemia, unspecified: Secondary | ICD-10-CM | POA: Insufficient documentation

## 2023-06-30 NOTE — Patient Instructions (Addendum)
 Patient will take high dose Vitamin D supplement weekly. Has impaired glucose tolerance and hyperlipidemia. Needs to watch diet and lose some weight. Start low dose rosuvastatin. Follow up in June.

## 2023-07-01 DIAGNOSIS — Z124 Encounter for screening for malignant neoplasm of cervix: Secondary | ICD-10-CM | POA: Diagnosis not present

## 2023-07-01 DIAGNOSIS — Z1231 Encounter for screening mammogram for malignant neoplasm of breast: Secondary | ICD-10-CM | POA: Diagnosis not present

## 2023-07-01 DIAGNOSIS — Z01419 Encounter for gynecological examination (general) (routine) without abnormal findings: Secondary | ICD-10-CM | POA: Diagnosis not present

## 2023-07-01 DIAGNOSIS — Z6827 Body mass index (BMI) 27.0-27.9, adult: Secondary | ICD-10-CM | POA: Diagnosis not present

## 2023-07-01 DIAGNOSIS — Z1151 Encounter for screening for human papillomavirus (HPV): Secondary | ICD-10-CM | POA: Diagnosis not present

## 2023-07-01 LAB — HM PAP SMEAR: HM Pap smear: NEGATIVE

## 2023-07-01 LAB — HM MAMMOGRAPHY

## 2023-07-12 ENCOUNTER — Ambulatory Visit (HOSPITAL_COMMUNITY)
Admission: RE | Admit: 2023-07-12 | Discharge: 2023-07-12 | Disposition: A | Discharge status: Home / Self Care | Payer: Self-pay | Source: Ambulatory Visit | Attending: Internal Medicine | Admitting: Internal Medicine

## 2023-07-12 DIAGNOSIS — R531 Weakness: Secondary | ICD-10-CM | POA: Diagnosis not present

## 2023-07-12 DIAGNOSIS — M546 Pain in thoracic spine: Secondary | ICD-10-CM | POA: Diagnosis not present

## 2023-07-12 DIAGNOSIS — M25561 Pain in right knee: Secondary | ICD-10-CM | POA: Diagnosis not present

## 2023-07-12 DIAGNOSIS — Z8249 Family history of ischemic heart disease and other diseases of the circulatory system: Secondary | ICD-10-CM | POA: Insufficient documentation

## 2023-07-14 ENCOUNTER — Other Ambulatory Visit: Payer: Self-pay | Admitting: Internal Medicine

## 2023-07-14 ENCOUNTER — Other Ambulatory Visit: Payer: Self-pay

## 2023-07-14 DIAGNOSIS — F419 Anxiety disorder, unspecified: Secondary | ICD-10-CM

## 2023-07-14 MED ORDER — FLUOXETINE HCL 20 MG PO TABS: 20.0000 mg | ORAL_TABLET | Freq: Every day | ORAL | 0 refills | Status: DC

## 2023-07-19 DIAGNOSIS — R531 Weakness: Secondary | ICD-10-CM | POA: Diagnosis not present

## 2023-07-19 DIAGNOSIS — M546 Pain in thoracic spine: Secondary | ICD-10-CM | POA: Diagnosis not present

## 2023-07-26 DIAGNOSIS — M546 Pain in thoracic spine: Secondary | ICD-10-CM | POA: Diagnosis not present

## 2023-07-26 DIAGNOSIS — R531 Weakness: Secondary | ICD-10-CM | POA: Diagnosis not present

## 2023-07-27 DIAGNOSIS — M25561 Pain in right knee: Secondary | ICD-10-CM | POA: Diagnosis not present

## 2023-08-02 DIAGNOSIS — M546 Pain in thoracic spine: Secondary | ICD-10-CM | POA: Diagnosis not present

## 2023-08-02 DIAGNOSIS — R531 Weakness: Secondary | ICD-10-CM | POA: Diagnosis not present

## 2023-08-11 DIAGNOSIS — M546 Pain in thoracic spine: Secondary | ICD-10-CM | POA: Diagnosis not present

## 2023-08-11 DIAGNOSIS — R531 Weakness: Secondary | ICD-10-CM | POA: Diagnosis not present

## 2023-08-11 NOTE — Progress Notes (Signed)
 Annual Wellness Visit   Patient Care Team: Sylvan Evener, MD as PCP - General (Internal Medicine)  Visit Date: 08/24/23   Chief Complaint  Patient presents with   Annual Exam   Subjective:  Patient: Judy [name], Female DOB: Dec 16, 1980, 43 y.o. MRN: [id number]  Judy [name] is a 43 y.o. Female who presents today for her Annual Wellness Visit. Patient has Palpitations; Anxiety; Pure Hypercholesterolemia; and Vitamin-D Deficiency.  History of Pure Hypercholesterolemia treated with Rosuvastatin  5 mg daily. 06/21/2023 Lipid Panel, compared to 2021: Cholesterol 244, elevated from 210; LDL 167, elevated from 139. 07/12/2023 Coronary Cardiac Score: 0.  History of Gestational Diabetes; Diabetes Mellitus with 06/28/2023 HgbA1c 5.7, 06/21/2023 Glucose 105.   History of Anxiety/Depression treated with Xanax  0.25 - 0.5 mg twice daily as needed and Prozac  20 mg daily.   History of Vitamin-D Deficiency treated with Vitamin-D 50,000 units weekly. 06/21/2023 Vitamin-D: 23.  Labs 06/21/2023 CBC: WNL CMP: WNL except for Glucose  TSH: 0.88  Done with Dr. Avis Boehringer, Gynecologist: PAP Smear 07/01/2023 normal; Mammogram 07/01/2023.   Colonoscopy will be due 2028.  Bone Density will be due 2048.   Vaccine Counseling: Due for Covid-19; UTD on Flu and Tdap.  Past Medical History:  Diagnosis Date   Constipation    Diabetes mellitus without complication (HCC)    Epigastric pain    Gallstones    Recently diagnosed by Dr. Adan Holms.   Gestational diabetes    Headache(784.0)    Hx of varicella    Hyperlipidemia    Rash    Bruises easily, per medical history form dated 09/15/10   Medical/Surgical History Narrative:   Allergic/Intolerant to: No Known Allergies  2020 - Covid-19 in November, received IV fluids in ED but was not admitted, and had no residual side effects.  Other - Hx of: Pneumonia, Right Lower Lobe; Surghx of: Cholecystectomy; Abdominoplasty; Rhinoplasty Family History  Problem  Relation Age of Onset   Diabetes Maternal Grandfather    Heart disease Father    Heart attack Father    Fibromyalgia Maternal Aunt    Diabetes Maternal Aunt    Family History Narrative: Father, deceased, w/ hx of MI Maternal Grandfather w/ hx of Diabetes Maternal Aunt w/ hx of Diabetes and Fibromyalgia Mother healthy as far as is known 1 Sister healthy as far as is known Social History   Social History Narrative   Non-smoker or drinker. Works at [employer] and [employer].   Review of Systems  Constitutional:  Negative for chills, fever, malaise/fatigue and weight loss.  HENT:  Negative for hearing loss, sinus pain and sore throat.   Respiratory:  Negative for cough, hemoptysis and shortness of breath.   Cardiovascular:  Negative for chest pain, palpitations, leg swelling and PND.  Gastrointestinal:  Negative for abdominal pain, constipation, diarrhea, heartburn, nausea and vomiting.  Genitourinary:  Negative for dysuria, frequency and urgency.  Musculoskeletal:  Negative for back pain, myalgias and neck pain.  Skin:  Negative for itching and rash.  Neurological:  Negative for dizziness, tingling, seizures and headaches.  Endo/Heme/Allergies:  Negative for polydipsia.  Psychiatric/Behavioral:  Negative for depression. The patient is not nervous/anxious.     Objective:  Vitals: BP 120/80   Pulse 82   Ht 5\' 10"  (1.778 m)   Wt 186 lb (84.4 kg)   LMP 08/16/2023   SpO2 98%   BMI 26.69 kg/m  Physical Exam Vitals and nursing note reviewed.  Constitutional:      General: She is  not in acute distress.    Appearance: Normal appearance. She is not ill-appearing or toxic-appearing.  HENT:     Head: Normocephalic and atraumatic.     Right Ear: Hearing, tympanic membrane, ear canal and external ear normal.     Left Ear: Hearing, tympanic membrane, ear canal and external ear normal.     Mouth/Throat:     Pharynx: Oropharynx is clear.  Eyes:     Extraocular Movements:  Extraocular movements intact.     Pupils: Pupils are equal, round, and reactive to light.  Neck:     Thyroid: No thyroid mass, thyromegaly or thyroid tenderness.     Vascular: No carotid bruit.  Cardiovascular:     Rate and Rhythm: Normal rate and regular rhythm. No extrasystoles are present.    Pulses:          Dorsalis pedis pulses are 2+ on the right side and 2+ on the left side.     Heart sounds: Normal heart sounds. No murmur heard.    No friction rub. No gallop.  Pulmonary:     Effort: Pulmonary effort is normal.     Breath sounds: Normal breath sounds. No decreased breath sounds, wheezing, rhonchi or rales.  Chest:     Chest wall: No mass.     Comments: Breast exam deferred to GYN Abdominal:     Palpations: Abdomen is soft. There is no hepatomegaly, splenomegaly or mass.     Tenderness: There is no abdominal tenderness.     Hernia: No hernia is present.  Genitourinary:    Comments: Pelvic exam deferred to GYN Musculoskeletal:     Cervical back: Normal range of motion.     Right lower leg: No edema.     Left lower leg: No edema.  Lymphadenopathy:     Cervical: No cervical adenopathy.     Upper Body:     Right upper body: No supraclavicular adenopathy.     Left upper body: No supraclavicular adenopathy.  Skin:    General: Skin is warm and dry.  Neurological:     General: No focal deficit present.     Mental Status: She is alert and oriented to person, place, and time. Mental status is at baseline.     Sensory: Sensation is intact.     Motor: Motor function is intact. No weakness.     Deep Tendon Reflexes: Reflexes are normal and symmetric.  Psychiatric:        Attention and Perception: Attention normal.        Mood and Affect: Mood normal.        Speech: Speech normal.        Behavior: Behavior normal.        Thought Content: Thought content normal.        Cognition and Memory: Cognition normal.        Judgment: Judgment normal.   Most Recent Fall Risk  Assessment:    06/28/2023   10:41 AM  Fall Risk   Falls in the past year? 0  Number falls in past yr: 0  Injury with Fall? 0  Risk for fall due to : No Fall Risks  Follow up Falls evaluation completed   Most Recent Depression Screenings:    06/28/2023   11:06 AM 06/17/2023    3:16 PM  PHQ 2/9 Scores  PHQ - 2 Score 4 5  PHQ- 9 Score 11 14   Results:  Studies Obtained And Personally Reviewed By Me:  PAP  Smear 07/01/2023 normal  07/12/2023 Coronary Calcium  Score     FINDINGS: Coronary arteries: Normal origins.   Coronary Calcium  Score:   Left main: 0   Left anterior descending artery: 0   Left circumflex artery: 0   Right coronary artery: 0   Total: 0   Pericardium: Normal.   Ascending Aorta: Normal caliber.  IMPRESSION: Coronary calcium  score of 0.   Non-cardiac: OVER-READ INTERPRETATION CT CHEST    FINDINGS: Vascular: No aortic atherosclerosis. The included aorta is normal in caliber.   Mediastinum/nodes: No adenopathy or mass. Unremarkable esophagus.   Lungs: Mild subsegmental atelectasis or scarring in the left lower lobe. No pulmonary nodule. No pleural fluid. The included airways are patent.   Upper abdomen: No acute or unexpected findings.   Musculoskeletal: There are no acute or suspicious osseous abnormalities.   IMPRESSION: No acute or unexpected extracardiac findings.   Labs:     Component Value Date/Time   NA 140 06/21/2023 1114   K 4.0 06/21/2023 1114   CL 105 06/21/2023 1114   CO2 26 06/21/2023 1114   GLUCOSE 105 (H) 06/21/2023 1114   BUN 12 06/21/2023 1114   CREATININE 0.67 06/21/2023 1114   CALCIUM  9.5 06/21/2023 1114   PROT 7.3 06/21/2023 1114   ALBUMIN 4.0 02/20/2019 1223   AST 13 06/21/2023 1114   ALT 14 06/21/2023 1114   ALKPHOS 46 02/20/2019 1223   BILITOT 0.4 06/21/2023 1114   GFRNONAA 94 04/14/2019 0943   GFRAA 109 04/14/2019 0943    Lab Results  Component Value Date   WBC 5.8 06/21/2023   HGB 14.8 06/21/2023    HCT 43.9 06/21/2023   MCV 86.2 06/21/2023   PLT 270 06/21/2023   Lab Results  Component Value Date   CHOL 244 (H) 06/21/2023   HDL 53 06/21/2023   LDLCALC 167 (H) 06/21/2023   TRIG 109 06/21/2023   CHOLHDL 4.6 06/21/2023   Lab Results  Component Value Date   HGBA1C 5.7 (H) 06/28/2023    Lab Results  Component Value Date   TSH 0.88 06/21/2023    Assessment & Plan:  Other Labs Reviewed today: CBC: WNL CMP: WNL except for Glucose  TSH: 0.88  Pure Hypercholesterolemia treated with Rosuvastatin  5 mg daily. 06/21/2023 Lipid Panel, compared to 2021: Cholesterol 244, elevated from 210; LDL 167, elevated from 139. 07/12/2023 Coronary Cardiac Score: 0.  History of Gestational Diabetes; Diabetes Mellitus with 06/28/2023 HgbA1c 5.7, 06/21/2023 Glucose 105.   Anxiety/Depression treated with Xanax  0.25 - 0.5 mg twice daily as needed and Prozac  20 mg daily.   Vitamin-D Deficiency treated with Vitamin-D 50,000 units weekly. 06/21/2023 Vitamin-D: 23.  PAP Smear 07/01/2023 normal.  Mammogram 07/01/2023.   Colonoscopy will be due 2028.  Bone Density will be due 2048.   Vaccine Counseling: Due for Covid-19; UTD on Flu and Tdap.   Annual wellness visit done today including the all of the following: Reviewed patient's Family Medical History Reviewed and updated list of patient's medical providers Assessment of cognitive impairment was done Assessed patient's functional ability Established a written schedule for health screening services Health Risk Assessent Completed and Reviewed  Discussed health benefits of physical activity, and encouraged her to engage in regular exercise appropriate for her age and condition.    I,Emily Lagle,acting as a Neurosurgeon for Sylvan Evener, MD.,have documented all relevant documentation on the behalf of Sylvan Evener, MD,as directed by  Sylvan Evener, MD while in the presence of Sylvan Evener, MD.  ISylvan Evener, MD, have reviewed all documentation for  this visit. The documentation on 08/25/23 for the exam, diagnosis, procedures, and orders are all accurate and complete.

## 2023-08-18 DIAGNOSIS — M5416 Radiculopathy, lumbar region: Secondary | ICD-10-CM | POA: Diagnosis not present

## 2023-08-18 DIAGNOSIS — R531 Weakness: Secondary | ICD-10-CM | POA: Diagnosis not present

## 2023-08-24 ENCOUNTER — Encounter: Payer: Self-pay | Admitting: Internal Medicine

## 2023-08-24 ENCOUNTER — Ambulatory Visit (INDEPENDENT_AMBULATORY_CARE_PROVIDER_SITE_OTHER): Admitting: Internal Medicine

## 2023-08-24 VITALS — BP 120/80 | HR 82 | Ht 70.0 in | Wt 186.0 lb

## 2023-08-24 DIAGNOSIS — E559 Vitamin D deficiency, unspecified: Secondary | ICD-10-CM

## 2023-08-24 DIAGNOSIS — E7800 Pure hypercholesterolemia, unspecified: Secondary | ICD-10-CM | POA: Diagnosis not present

## 2023-08-24 DIAGNOSIS — F419 Anxiety disorder, unspecified: Secondary | ICD-10-CM

## 2023-08-24 DIAGNOSIS — Z0000 Encounter for general adult medical examination without abnormal findings: Secondary | ICD-10-CM | POA: Diagnosis not present

## 2023-08-24 DIAGNOSIS — R7303 Prediabetes: Secondary | ICD-10-CM | POA: Diagnosis not present

## 2023-08-24 NOTE — Patient Instructions (Signed)
 It was a pleasure to se you today. Please return tomorrow for fasting labs. Tetanus vaccine was given in 2017.

## 2023-08-25 DIAGNOSIS — M546 Pain in thoracic spine: Secondary | ICD-10-CM | POA: Diagnosis not present

## 2023-08-25 DIAGNOSIS — R531 Weakness: Secondary | ICD-10-CM | POA: Diagnosis not present

## 2023-08-26 ENCOUNTER — Encounter: Payer: Self-pay | Admitting: Internal Medicine

## 2023-09-01 DIAGNOSIS — M546 Pain in thoracic spine: Secondary | ICD-10-CM | POA: Diagnosis not present

## 2023-09-01 DIAGNOSIS — R531 Weakness: Secondary | ICD-10-CM | POA: Diagnosis not present

## 2023-09-06 DIAGNOSIS — R531 Weakness: Secondary | ICD-10-CM | POA: Diagnosis not present

## 2023-09-06 DIAGNOSIS — M546 Pain in thoracic spine: Secondary | ICD-10-CM | POA: Diagnosis not present

## 2023-09-07 DIAGNOSIS — M25561 Pain in right knee: Secondary | ICD-10-CM | POA: Diagnosis not present

## 2023-09-13 DIAGNOSIS — R531 Weakness: Secondary | ICD-10-CM | POA: Diagnosis not present

## 2023-09-13 DIAGNOSIS — M546 Pain in thoracic spine: Secondary | ICD-10-CM | POA: Diagnosis not present

## 2023-09-20 DIAGNOSIS — M546 Pain in thoracic spine: Secondary | ICD-10-CM | POA: Diagnosis not present

## 2023-09-20 DIAGNOSIS — R531 Weakness: Secondary | ICD-10-CM | POA: Diagnosis not present

## 2023-09-22 ENCOUNTER — Other Ambulatory Visit: Payer: Self-pay | Admitting: Internal Medicine

## 2023-09-22 DIAGNOSIS — F419 Anxiety disorder, unspecified: Secondary | ICD-10-CM

## 2023-09-27 DIAGNOSIS — M546 Pain in thoracic spine: Secondary | ICD-10-CM | POA: Diagnosis not present

## 2023-09-27 DIAGNOSIS — R531 Weakness: Secondary | ICD-10-CM | POA: Diagnosis not present

## 2023-09-29 ENCOUNTER — Other Ambulatory Visit: Payer: Self-pay | Admitting: Internal Medicine

## 2023-09-29 DIAGNOSIS — F419 Anxiety disorder, unspecified: Secondary | ICD-10-CM

## 2023-10-06 DIAGNOSIS — R531 Weakness: Secondary | ICD-10-CM | POA: Diagnosis not present

## 2023-10-06 DIAGNOSIS — M546 Pain in thoracic spine: Secondary | ICD-10-CM | POA: Diagnosis not present

## 2023-10-25 DIAGNOSIS — M6281 Muscle weakness (generalized): Secondary | ICD-10-CM | POA: Diagnosis not present

## 2023-10-25 DIAGNOSIS — M25661 Stiffness of right knee, not elsewhere classified: Secondary | ICD-10-CM | POA: Diagnosis not present

## 2023-10-29 ENCOUNTER — Telehealth (INDEPENDENT_AMBULATORY_CARE_PROVIDER_SITE_OTHER): Admitting: Internal Medicine

## 2023-10-29 ENCOUNTER — Telehealth: Payer: Self-pay

## 2023-10-29 DIAGNOSIS — Z20828 Contact with and (suspected) exposure to other viral communicable diseases: Secondary | ICD-10-CM | POA: Diagnosis not present

## 2023-10-29 MED ORDER — OSELTAMIVIR PHOSPHATE 75 MG PO CAPS: 75.0000 mg | ORAL_CAPSULE | Freq: Two times a day (BID) | ORAL | 0 refills | Status: AC

## 2023-10-29 NOTE — Progress Notes (Signed)
 Patient Care Team: Judy Ronal PARAS, MD as PCP - General (Internal Medicine)  I connected with Judy [name] on 10/29/23 at 4:25 PM by video enabled telemedicine visit and verified that I am speaking with the correct person using two identifiers, myself and Judy [name], CMA. I am in my office and patient is in their home.    I discussed the limitations, risks, security and privacy concerns of performing an evaluation and management service by telemedicine and the availability of in-person appointments. I also discussed with the patient that there may be a patient responsible charge related to this service. The patient expressed understanding and agreed to proceed.   Other persons participating in the visit and their role in the encounter: Medical scribe, Judy [name]  Patient's location: Home  Provider's location: Clinic   Chief Complaint  Patient presents with   exposure to the flu   Subjective:  Patient PI:[name],Female DOB:1981/03/02,43 y.o. FMW:[id number]   42 y.o. Female presents today for acute sick visit with Flu B Exposure. Patient has a past medical history of Palpitations; Anxiety; Pure Hypercholesterolemia; and Vitamin-D Deficiency. Says that she dropped her children at [facility] 7/27, and recently her daughter tested positive for Flu B while there, so she had to pick her up and has had close quarters contact with her.  Past Medical History:  Diagnosis Date   Constipation    Diabetes mellitus without complication (HCC)    Epigastric pain    Gallstones    Recently diagnosed by Dr. Jakie.   Gestational diabetes    Headache(784.0)    Hx of varicella    Hyperlipidemia    Rash    Bruises easily, per medical history form dated 09/15/10  No Known Allergies Past Surgical History:  Procedure Laterality Date   CHOLECYSTECTOMY     RHINOPLASTY     Family History  Problem Relation Age of Onset   Diabetes Maternal Grandfather    Heart disease Father     Heart attack Father    Fibromyalgia Maternal Aunt    Diabetes Maternal Aunt    Social History   Social History Narrative   Non-smoker or drinker. Works at [employer] and [employer].    Review of Systems  Constitutional:  Negative for fever and malaise/fatigue.  HENT:  Negative for congestion.   Eyes:  Negative for blurred vision.  Respiratory:  Negative for cough and shortness of breath.   Cardiovascular:  Negative for chest pain, palpitations and leg swelling.  Gastrointestinal:  Negative for vomiting.  Musculoskeletal:  Negative for back pain.  Skin:  Negative for rash.  Neurological:  Negative for loss of consciousness and headaches.     Objective:  Vitals provided by patient: There were no vitals taken for this visit. Physical exam limited due to nature of visit Physical Exam Vitals and nursing note reviewed.  Constitutional:      General: She is not in acute distress.    Appearance: Normal appearance. She is not toxic-appearing.  HENT:     Head: Normocephalic and atraumatic.  Pulmonary:     Effort: Pulmonary effort is normal.  Skin:    General: Skin is warm and dry.  Neurological:     Mental Status: She is alert and oriented to person, place, and time. Mental status is at baseline.  Psychiatric:        Mood and Affect: Mood normal.        Behavior: Behavior normal.  Thought Content: Thought content normal.        Judgment: Judgment normal.     Results:  Studies Obtained And Personally Reviewed By Me: Labs:  CBC w/ Differential Lab Results  Component Value Date   WBC 5.8 06/21/2023   RBC 5.09 06/21/2023   HGB 14.8 06/21/2023   HCT 43.9 06/21/2023   PLT 270 06/21/2023   MCV 86.2 06/21/2023   MCH 29.1 06/21/2023   MCHC 33.7 06/21/2023   RDW 12.7 06/21/2023   MPV 10.2 06/21/2023   LYMPHSABS 2,380 04/14/2019   MONOABS 0.3 02/20/2019   BASOSABS 41 06/21/2023    Comprehensive Metabolic Panel Lab Results  Component Value Date   NA 140  06/21/2023   K 4.0 06/21/2023   CL 105 06/21/2023   CO2 26 06/21/2023   GLUCOSE 105 (H) 06/21/2023   BUN 12 06/21/2023   CREATININE 0.67 06/21/2023   CALCIUM  9.5 06/21/2023   PROT 7.3 06/21/2023   ALBUMIN 4.0 02/20/2019   AST 13 06/21/2023   ALT 14 06/21/2023   ALKPHOS 46 02/20/2019   BILITOT 0.4 06/21/2023   GFR 95.40 08/29/2010   GFRNONAA 94 04/14/2019   Lipid Panel  Lab Results  Component Value Date   CHOL 244 (H) 06/21/2023   HDL 53 06/21/2023   LDLCALC 167 (H) 06/21/2023   TRIG 109 06/21/2023   A1c Lab Results  Component Value Date   HGBA1C 5.7 (H) 06/28/2023    TSH Lab Results  Component Value Date   TSH 0.88 06/21/2023   Assessment & Plan:  I provided 15 minutes of time spent during this encounter, including chart review, interviewing patient, medical decision making, and e-scribing medication with > 50% was spent counseling as documented under my assessment & plan.   Meds ordered this encounter  Medications   oseltamivir  (TAMIFLU ) 75 MG capsule    Sig: Take 1 capsule (75 mg total) by mouth 2 (two) times daily.    Dispense:  10 capsule    Refill:  0  Exposure To Influenza: she reportedly dropped her children at [facility] 7/27, and recently her daughter tested positive for Flu B while there, so she had to pick her up and has had close quarters contact with her.  For prophylactic measures, sending in Tamiflu  75 mg to take twice daily for 5 days. She may take prophylactically.   I,Emily Lagle,acting as a Neurosurgeon for [name], MD.,have documented all relevant documentation on the behalf of [name], MD,as directed by  [name], MD while in the presence of [name], MD.  I, [name], MD, have reviewed all documentation for this visit. The documentation on 10/29/2023 for the exam, diagnosis, procedures, and orders are all accurate and complete.

## 2023-10-29 NOTE — Telephone Encounter (Signed)
 We will need to do a video visit.   Copied from CRM [id number]. Topic: Clinical - Medication Question >> Oct 29, 2023  3:58 PM Judy [name] wrote: Reason for CRM: pt is calling in seeing if she can get a rx of tamaflu her daughter tested positive for flu [name] and she has been in contact with her and needs a rx please call pt [phone] (M)

## 2023-10-31 NOTE — Patient Instructions (Signed)
 We are sorry to hear about your exposure to Influenza B. Sending in Tamiflu  as requested take as prophylaxis. Call back if you have any questions or concerns.

## 2023-11-09 DIAGNOSIS — M25561 Pain in right knee: Secondary | ICD-10-CM | POA: Diagnosis not present

## 2023-11-26 DIAGNOSIS — M542 Cervicalgia: Secondary | ICD-10-CM | POA: Diagnosis not present

## 2023-12-01 DIAGNOSIS — G8918 Other acute postprocedural pain: Secondary | ICD-10-CM | POA: Diagnosis not present

## 2023-12-01 DIAGNOSIS — M238X1 Other internal derangements of right knee: Secondary | ICD-10-CM | POA: Diagnosis not present

## 2023-12-03 DIAGNOSIS — M25561 Pain in right knee: Secondary | ICD-10-CM | POA: Diagnosis not present

## 2023-12-03 DIAGNOSIS — M25661 Stiffness of right knee, not elsewhere classified: Secondary | ICD-10-CM | POA: Diagnosis not present

## 2023-12-03 DIAGNOSIS — R2689 Other abnormalities of gait and mobility: Secondary | ICD-10-CM | POA: Diagnosis not present

## 2023-12-03 DIAGNOSIS — S83511S Sprain of anterior cruciate ligament of right knee, sequela: Secondary | ICD-10-CM | POA: Diagnosis not present

## 2023-12-06 DIAGNOSIS — S83511S Sprain of anterior cruciate ligament of right knee, sequela: Secondary | ICD-10-CM | POA: Diagnosis not present

## 2023-12-06 DIAGNOSIS — R2689 Other abnormalities of gait and mobility: Secondary | ICD-10-CM | POA: Diagnosis not present

## 2023-12-06 DIAGNOSIS — M25661 Stiffness of right knee, not elsewhere classified: Secondary | ICD-10-CM | POA: Diagnosis not present

## 2023-12-06 DIAGNOSIS — M25561 Pain in right knee: Secondary | ICD-10-CM | POA: Diagnosis not present

## 2023-12-10 DIAGNOSIS — M7651 Patellar tendinitis, right knee: Secondary | ICD-10-CM | POA: Diagnosis not present

## 2023-12-16 DIAGNOSIS — S83511S Sprain of anterior cruciate ligament of right knee, sequela: Secondary | ICD-10-CM | POA: Diagnosis not present

## 2023-12-16 DIAGNOSIS — M25661 Stiffness of right knee, not elsewhere classified: Secondary | ICD-10-CM | POA: Diagnosis not present

## 2023-12-16 DIAGNOSIS — R2689 Other abnormalities of gait and mobility: Secondary | ICD-10-CM | POA: Diagnosis not present

## 2023-12-16 DIAGNOSIS — M25561 Pain in right knee: Secondary | ICD-10-CM | POA: Diagnosis not present

## 2023-12-21 DIAGNOSIS — M25661 Stiffness of right knee, not elsewhere classified: Secondary | ICD-10-CM | POA: Diagnosis not present

## 2023-12-21 DIAGNOSIS — S83511S Sprain of anterior cruciate ligament of right knee, sequela: Secondary | ICD-10-CM | POA: Diagnosis not present

## 2023-12-21 DIAGNOSIS — M25561 Pain in right knee: Secondary | ICD-10-CM | POA: Diagnosis not present

## 2023-12-21 DIAGNOSIS — R2689 Other abnormalities of gait and mobility: Secondary | ICD-10-CM | POA: Diagnosis not present

## 2023-12-24 DIAGNOSIS — R2689 Other abnormalities of gait and mobility: Secondary | ICD-10-CM | POA: Diagnosis not present

## 2023-12-24 DIAGNOSIS — M25561 Pain in right knee: Secondary | ICD-10-CM | POA: Diagnosis not present

## 2023-12-24 DIAGNOSIS — M47812 Spondylosis without myelopathy or radiculopathy, cervical region: Secondary | ICD-10-CM | POA: Diagnosis not present

## 2023-12-24 DIAGNOSIS — M25661 Stiffness of right knee, not elsewhere classified: Secondary | ICD-10-CM | POA: Diagnosis not present

## 2023-12-24 DIAGNOSIS — S83511S Sprain of anterior cruciate ligament of right knee, sequela: Secondary | ICD-10-CM | POA: Diagnosis not present

## 2023-12-27 DIAGNOSIS — R2689 Other abnormalities of gait and mobility: Secondary | ICD-10-CM | POA: Diagnosis not present

## 2023-12-27 DIAGNOSIS — M25561 Pain in right knee: Secondary | ICD-10-CM | POA: Diagnosis not present

## 2023-12-27 DIAGNOSIS — S83511D Sprain of anterior cruciate ligament of right knee, subsequent encounter: Secondary | ICD-10-CM | POA: Diagnosis not present

## 2023-12-27 DIAGNOSIS — M25661 Stiffness of right knee, not elsewhere classified: Secondary | ICD-10-CM | POA: Diagnosis not present

## 2023-12-31 DIAGNOSIS — S83511D Sprain of anterior cruciate ligament of right knee, subsequent encounter: Secondary | ICD-10-CM | POA: Diagnosis not present

## 2023-12-31 DIAGNOSIS — M25561 Pain in right knee: Secondary | ICD-10-CM | POA: Diagnosis not present

## 2023-12-31 DIAGNOSIS — R2689 Other abnormalities of gait and mobility: Secondary | ICD-10-CM | POA: Diagnosis not present

## 2023-12-31 DIAGNOSIS — M25661 Stiffness of right knee, not elsewhere classified: Secondary | ICD-10-CM | POA: Diagnosis not present

## 2024-01-03 DIAGNOSIS — S83511D Sprain of anterior cruciate ligament of right knee, subsequent encounter: Secondary | ICD-10-CM | POA: Diagnosis not present

## 2024-01-03 DIAGNOSIS — M25661 Stiffness of right knee, not elsewhere classified: Secondary | ICD-10-CM | POA: Diagnosis not present

## 2024-01-03 DIAGNOSIS — R2689 Other abnormalities of gait and mobility: Secondary | ICD-10-CM | POA: Diagnosis not present

## 2024-01-03 DIAGNOSIS — M25561 Pain in right knee: Secondary | ICD-10-CM | POA: Diagnosis not present

## 2024-01-10 DIAGNOSIS — M25561 Pain in right knee: Secondary | ICD-10-CM | POA: Diagnosis not present

## 2024-01-10 DIAGNOSIS — S83511D Sprain of anterior cruciate ligament of right knee, subsequent encounter: Secondary | ICD-10-CM | POA: Diagnosis not present

## 2024-01-10 DIAGNOSIS — R2689 Other abnormalities of gait and mobility: Secondary | ICD-10-CM | POA: Diagnosis not present

## 2024-01-10 DIAGNOSIS — M25661 Stiffness of right knee, not elsewhere classified: Secondary | ICD-10-CM | POA: Diagnosis not present

## 2024-02-10 DIAGNOSIS — M47812 Spondylosis without myelopathy or radiculopathy, cervical region: Secondary | ICD-10-CM | POA: Diagnosis not present

## 2024-03-06 DIAGNOSIS — M47812 Spondylosis without myelopathy or radiculopathy, cervical region: Secondary | ICD-10-CM | POA: Diagnosis not present
